# Patient Record
Sex: Female | Born: 1990 | Race: White | Hispanic: No | Marital: Single | Smoking: Current every day smoker
Health system: Southern US, Community
[De-identification: ages and names within clinical notes are randomized; demographics above are authoritative.]

## PROBLEM LIST (undated history)

## (undated) ENCOUNTER — Ambulatory Visit: Admission: EM | Payer: BC Managed Care – PPO | Source: Home / Self Care

## (undated) DIAGNOSIS — R011 Cardiac murmur, unspecified: Secondary | ICD-10-CM

## (undated) DIAGNOSIS — J449 Chronic obstructive pulmonary disease, unspecified: Secondary | ICD-10-CM

## (undated) DIAGNOSIS — J45909 Unspecified asthma, uncomplicated: Secondary | ICD-10-CM

## (undated) HISTORY — PX: TONSILLECTOMY: SUR1361

---
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
## 2010-12-09 ENCOUNTER — Emergency Department: Payer: Self-pay | Admitting: Emergency Medicine

## 2012-06-13 ENCOUNTER — Emergency Department: Payer: Self-pay | Admitting: Unknown Physician Specialty

## 2012-06-13 LAB — COMPREHENSIVE METABOLIC PANEL
Albumin: 4.1 g/dL (ref 3.4–5.0)
Anion Gap: 7 (ref 7–16)
BUN: 9 mg/dL (ref 7–18)
EGFR (Non-African Amer.): >60
SGOT(AST): 17 U/L (ref 15–37)
SGPT (ALT): 16 U/L
Total Protein: 7.3 g/dL (ref 6.4–8.2)

## 2012-06-13 LAB — CBC
HCT: 40.3 % (ref 35.0–47.0)
HGB: 13.6 g/dL (ref 12.0–16.0)
MCHC: 33.8 g/dL (ref 32.0–36.0)
MCV: 96 fL (ref 80–100)
Platelet: 189 10*3/uL (ref 150–440)
RDW: 13.8 % (ref 11.5–14.5)

## 2012-06-13 LAB — URINALYSIS, COMPLETE
Glucose,UR: NEGATIVE mg/dL (ref 0–75)
Leukocyte Esterase: NEGATIVE
Nitrite: NEGATIVE
RBC,UR: <1 /HPF (ref 0–5)
Specific Gravity: 1.015 (ref 1.003–1.030)

## 2014-04-11 ENCOUNTER — Emergency Department: Payer: Self-pay | Admitting: Emergency Medicine

## 2014-06-08 ENCOUNTER — Emergency Department: Payer: Self-pay | Admitting: Emergency Medicine

## 2014-06-08 LAB — URINALYSIS, COMPLETE
Bilirubin,UR: NEGATIVE
GLUCOSE, UR: NEGATIVE mg/dL (ref 0–75)
Ketone: NEGATIVE
Nitrite: NEGATIVE
PH: 7 (ref 4.5–8.0)
SPECIFIC GRAVITY: 1.004 (ref 1.003–1.030)
WBC UR: 117 /HPF (ref 0–5)

## 2014-06-08 LAB — COMPREHENSIVE METABOLIC PANEL
ALBUMIN: 4 g/dL (ref 3.4–5.0)
ALK PHOS: 88 U/L
ALT: 16 U/L (ref 12–78)
ANION GAP: 8 (ref 7–16)
AST: 27 U/L (ref 15–37)
BUN: 5 mg/dL — AB (ref 7–18)
Bilirubin,Total: 0.6 mg/dL (ref 0.2–1.0)
CO2: 23 mmol/L (ref 21–32)
CREATININE: 0.45 mg/dL — AB (ref 0.60–1.30)
Calcium, Total: 9.5 mg/dL (ref 8.5–10.1)
Chloride: 107 mmol/L (ref 98–107)
EGFR (Non-African Amer.): >60
Glucose: 96 mg/dL (ref 65–99)
Osmolality: 273 (ref 275–301)
Potassium: 4.6 mmol/L (ref 3.5–5.1)
SODIUM: 138 mmol/L (ref 136–145)
Total Protein: 7.3 g/dL (ref 6.4–8.2)

## 2014-06-08 LAB — CBC
HCT: 43.4 % (ref 35.0–47.0)
HGB: 14.6 g/dL (ref 12.0–16.0)
MCH: 32.3 pg (ref 26.0–34.0)
MCHC: 33.6 g/dL (ref 32.0–36.0)
MCV: 96 fL (ref 80–100)
Platelet: 194 10*3/uL (ref 150–440)
RBC: 4.52 10*6/uL (ref 3.80–5.20)
RDW: 13.6 % (ref 11.5–14.5)
WBC: 16 10*3/uL — AB (ref 3.6–11.0)

## 2014-06-08 LAB — PREGNANCY, URINE: Pregnancy Test, Urine: NEGATIVE m[IU]/mL

## 2014-07-06 ENCOUNTER — Emergency Department: Payer: Self-pay | Admitting: Emergency Medicine

## 2014-07-06 LAB — URINALYSIS, COMPLETE
BILIRUBIN, UR: NEGATIVE
BLOOD: NEGATIVE
Glucose,UR: NEGATIVE mg/dL (ref 0–75)
KETONE: NEGATIVE
Nitrite: NEGATIVE
Ph: 7 (ref 4.5–8.0)
SPECIFIC GRAVITY: 1.014 (ref 1.003–1.030)
Squamous Epithelial: 1

## 2014-07-09 LAB — URINE CULTURE

## 2014-11-13 ENCOUNTER — Emergency Department: Payer: Self-pay | Admitting: Emergency Medicine

## 2015-11-15 ENCOUNTER — Encounter: Payer: Self-pay | Admitting: Emergency Medicine

## 2015-11-15 ENCOUNTER — Emergency Department
Admission: EM | Admit: 2015-11-15 | Discharge: 2015-11-15 | Disposition: A | Discharge status: Home / Self Care | Payer: Self-pay | Attending: Emergency Medicine | Admitting: Emergency Medicine

## 2015-11-15 DIAGNOSIS — M545 Low back pain, unspecified: Secondary | ICD-10-CM

## 2015-11-15 DIAGNOSIS — N390 Urinary tract infection, site not specified: Secondary | ICD-10-CM | POA: Insufficient documentation

## 2015-11-15 DIAGNOSIS — Z3202 Encounter for pregnancy test, result negative: Secondary | ICD-10-CM | POA: Insufficient documentation

## 2015-11-15 DIAGNOSIS — F17200 Nicotine dependence, unspecified, uncomplicated: Secondary | ICD-10-CM | POA: Insufficient documentation

## 2015-11-15 DIAGNOSIS — J45909 Unspecified asthma, uncomplicated: Secondary | ICD-10-CM | POA: Insufficient documentation

## 2015-11-15 HISTORY — DX: Unspecified asthma, uncomplicated: J45.909

## 2015-11-15 HISTORY — DX: Cardiac murmur, unspecified: R01.1

## 2015-11-15 LAB — URINALYSIS COMPLETE WITH MICROSCOPIC (ARMC ONLY)
BILIRUBIN URINE: NEGATIVE
Glucose, UA: NEGATIVE mg/dL
KETONES UR: NEGATIVE mg/dL
NITRITE: NEGATIVE
PH: 7 (ref 5.0–8.0)
Protein, ur: NEGATIVE mg/dL
Specific Gravity, Urine: 1.015 (ref 1.005–1.030)

## 2015-11-15 LAB — POCT PREGNANCY, URINE: Preg Test, Ur: NEGATIVE

## 2015-11-15 MED ORDER — CEPHALEXIN 500 MG PO CAPS: 500.0000 mg | ORAL_CAPSULE | Freq: Three times a day (TID) | ORAL | Status: DC

## 2015-11-15 MED ORDER — PHENAZOPYRIDINE HCL 200 MG PO TABS
200.0000 mg | ORAL_TABLET | Freq: Three times a day (TID) | ORAL | Status: AC | PRN
Start: 2015-11-15 — End: 2016-11-14

## 2015-11-15 NOTE — ED Notes (Signed)
Has been having lower back pain for about 2 weeks   Now having nasal congestion and body aches

## 2015-11-15 NOTE — Discharge Instructions (Signed)
Please seek medical attention for any high fevers, chest pain, shortness of breath, change in behavior, persistent vomiting, bloody stool or any other new or concerning symptoms. ° ° °Urinary Tract Infection °Urinary tract infections (UTIs) can develop anywhere along your urinary tract. Your urinary tract is your body's drainage system for removing wastes and extra water. Your urinary tract includes two kidneys, two ureters, a bladder, and a urethra. Your kidneys are a pair of bean-shaped organs. Each kidney is about the size of your fist. They are located below your ribs, one on each side of your spine. °CAUSES °Infections are caused by microbes, which are microscopic organisms, including fungi, viruses, and bacteria. These organisms are so small that they can only be seen through a microscope. Bacteria are the microbes that most commonly cause UTIs. °SYMPTOMS  °Symptoms of UTIs may vary by age and gender of the patient and by the location of the infection. Symptoms in young women typically include a frequent and intense urge to urinate and a painful, burning feeling in the bladder or urethra during urination. Older women and men are more likely to be tired, shaky, and weak and have muscle aches and abdominal pain. A fever may mean the infection is in your kidneys. Other symptoms of a kidney infection include pain in your back or sides below the ribs, nausea, and vomiting. °DIAGNOSIS °To diagnose a UTI, your caregiver will ask you about your symptoms. Your caregiver will also ask you to provide a urine sample. The urine sample will be tested for bacteria and white blood cells. White blood cells are made by your body to help fight infection. °TREATMENT  °Typically, UTIs can be treated with medication. Because most UTIs are caused by a bacterial infection, they usually can be treated with the use of antibiotics. The choice of antibiotic and length of treatment depend on your symptoms and the type of bacteria causing  your infection. °HOME CARE INSTRUCTIONS °· If you were prescribed antibiotics, take them exactly as your caregiver instructs you. Finish the medication even if you feel better after you have only taken some of the medication. °· Drink enough water and fluids to keep your urine clear or pale yellow. °· Avoid caffeine, tea, and carbonated beverages. They tend to irritate your bladder. °· Empty your bladder often. Avoid holding urine for long periods of time. °· Empty your bladder before and after sexual intercourse. °· After a bowel movement, women should cleanse from front to back. Use each tissue only once. °SEEK MEDICAL CARE IF:  °· You have back pain. °· You develop a fever. °· Your symptoms do not begin to resolve within 3 days. °SEEK IMMEDIATE MEDICAL CARE IF:  °· You have severe back pain or lower abdominal pain. °· You develop chills. °· You have nausea or vomiting. °· You have continued burning or discomfort with urination. °MAKE SURE YOU:  °· Understand these instructions. °· Will watch your condition. °· Will get help right away if you are not doing well or get worse. °  °This information is not intended to replace advice given to you by your health care provider. Make sure you discuss any questions you have with your health care provider. °  °Document Released: 09/13/2005 Document Revised: 08/25/2015 Document Reviewed: 01/12/2012 °Elsevier Interactive Patient Education ©2016 Elsevier Inc. ° °

## 2015-11-15 NOTE — ED Provider Notes (Signed)
[facility] Emergency Department Provider Note   ____________________________________________  Time seen: 1510  I have reviewed the triage vital signs and the nursing notes.   HISTORY  Chief Complaint Back Pain   History limited by: Not Limited   HPI Ann Gray is a 24 y.o. female who presents to the emergency department today with 2 main complaints. Her first complaint is left lower back pain. She states this is been going on for the past 2-3 weeks. It has been fairly constant. She states that it is a moderate to severe. She denies any obvious injury to her back. She states that she feels she has had some increased urination with this pain however denies any painful urination. In addition patient states for the past 3-4 days she's been having more generalized body aches, cold like symptoms including congestion and some chills. She states she has had multiple sick contacts including both at work and her family.    Past Medical History  Diagnosis Date  . Asthma   . Murmur, cardiac     There are no active problems to display for this patient.   History reviewed. No pertinent past surgical history.  No current outpatient prescriptions on file.  Allergies Review of patient's allergies indicates no known allergies.  No family history on file.  Social History Social History  Substance Use Topics  . Smoking status: Current Every Day Smoker  . Smokeless tobacco: None  . Alcohol Use: No    Review of Systems  Constitutional: Negative for fever. Cardiovascular: Negative for chest pain.  Respiratory: Negative for shortness of breath.Positive for cough and congestion  Gastrointestinal: Negative for abdominal pain, vomiting and diarrhea. Genitourinary: Negative for dysuria. Musculoskeletal: Positive for left low back. Skin: Negative for rash. Neurological: Negative for headaches, focal weakness or numbness.  10-point ROS otherwise  negative.  ____________________________________________   PHYSICAL EXAM:  VITAL SIGNS: ED Triage Vitals  Enc Vitals Group     BP 11/15/15 1459 117/65 mmHg     Pulse Rate 11/15/15 1459 87     Resp 11/15/15 1459 18     Temp 11/15/15 1459 99 F (37.2 C)     Temp Source 11/15/15 1459 Oral     SpO2 11/15/15 1459 100 %     Weight 11/15/15 1459 180 lb (81.647 kg)     Height 11/15/15 1459  (1.651 m)     Head Cir --      Peak Flow --      Pain Score 11/15/15 1456 4   Constitutional: Alert and oriented. Well appearing and in no distress. Eyes: Conjunctivae are normal. PERRL. Normal extraocular movements. ENT   Head: Normocephalic and atraumatic.   Nose: No congestion/rhinnorhea.   Mouth/Throat: Mucous membranes are moist.   Neck: No stridor. Hematological/Lymphatic/Immunilogical: No cervical lymphadenopathy. Cardiovascular: Normal rate, regular rhythm.  No murmurs, rubs, or gallops. Respiratory: Normal respiratory effort without tachypnea nor retractions. Breath sounds are clear and equal bilaterally. No wheezes/rales/rhonchi. Gastrointestinal: Soft and nontender. No distention. There is no CVA tenderness. Genitourinary: Deferred Musculoskeletal: Normal range of motion in all extremities. No joint effusions.  No lower extremity tenderness nor edema. Neurologic:  Normal speech and language. No gross focal neurologic deficits are appreciated.  Skin:  Skin is warm, dry and intact. No rash noted. Psychiatric: Mood and affect are normal. Speech and behavior are normal. Patient exhibits appropriate insight and judgment.  ____________________________________________    LABS (pertinent positives/negatives)  Labs Reviewed  URINALYSIS COMPLETEWITH MICROSCOPIC (  [facility] ONLY) - Abnormal; Notable for the following:    Color, Urine YELLOW (*)    APPearance HAZY (*)    Hgb urine dipstick 1+ (*)    Leukocytes, UA TRACE (*)    Bacteria, UA RARE (*)    Squamous Epithelial / LPF  0-5 (*)    All other components within normal limits  POCT PREGNANCY, URINE    ____________________________________________   EKG  None  ____________________________________________    RADIOLOGY  None   ____________________________________________   PROCEDURES  Procedure(s) performed: None  Critical Care performed: No  ____________________________________________   INITIAL IMPRESSION / ASSESSMENT AND PLAN / ED COURSE  Pertinent labs & imaging results that were available during my care of the patient were reviewed by me and considered in my medical decision making (see chart for details).  Patient presents to the emergency department today with concerns for cold and low back pain. Patient was positive for UTI. Will give patient prescription for antibiotics. Decided to discuss with the patient care for, cold.  ____________________________________________   FINAL CLINICAL IMPRESSION(S) / ED DIAGNOSES  Final diagnoses:  UTI (lower urinary tract infection)  Left-sided low back pain without sciatica     Phineas SemenGraydon Ren Aspinall, MD 11/15/15 1709

## 2016-03-11 ENCOUNTER — Encounter: Payer: Self-pay | Admitting: Emergency Medicine

## 2016-03-11 ENCOUNTER — Emergency Department
Admission: EM | Admit: 2016-03-11 | Discharge: 2016-03-12 | Disposition: A | Discharge status: Home / Self Care | Payer: BLUE CROSS/BLUE SHIELD | Attending: Emergency Medicine | Admitting: Emergency Medicine

## 2016-03-11 DIAGNOSIS — K029 Dental caries, unspecified: Secondary | ICD-10-CM

## 2016-03-11 DIAGNOSIS — Z792 Long term (current) use of antibiotics: Secondary | ICD-10-CM | POA: Insufficient documentation

## 2016-03-11 DIAGNOSIS — K0889 Other specified disorders of teeth and supporting structures: Secondary | ICD-10-CM | POA: Diagnosis present

## 2016-03-11 DIAGNOSIS — F17200 Nicotine dependence, unspecified, uncomplicated: Secondary | ICD-10-CM | POA: Insufficient documentation

## 2016-03-11 DIAGNOSIS — K047 Periapical abscess without sinus: Secondary | ICD-10-CM | POA: Insufficient documentation

## 2016-03-11 MED ORDER — PENICILLIN V POTASSIUM 500 MG PO TABS
500.0000 mg | ORAL_TABLET | Freq: Four times a day (QID) | ORAL | Status: DC
  Administered 2016-03-11: 500 mg via ORAL
  Filled 2016-03-11: qty 1

## 2016-03-11 MED ORDER — LIDOCAINE-EPINEPHRINE 2 %-1:100000 IJ SOLN
INTRAMUSCULAR | Status: AC
  Filled 2016-03-11: qty 3.4

## 2016-03-11 MED ORDER — TRAMADOL HCL 50 MG PO TABS: 50.0000 mg | ORAL_TABLET | Freq: Four times a day (QID) | ORAL | Status: DC | PRN

## 2016-03-11 MED ORDER — LIDOCAINE-EPINEPHRINE 2 %-1:100000 IJ SOLN: 1.7000 mL | Freq: Once | INTRAMUSCULAR | Status: DC

## 2016-03-11 MED ORDER — IBUPROFEN 800 MG PO TABS: 800.0000 mg | ORAL_TABLET | Freq: Three times a day (TID) | ORAL | Status: DC | PRN

## 2016-03-11 MED ORDER — PENICILLIN V POTASSIUM 500 MG PO TABS: 500.0000 mg | ORAL_TABLET | Freq: Four times a day (QID) | ORAL | Status: DC

## 2016-03-11 NOTE — ED Notes (Signed)
C/o dental pain to right jaw (upper and lower).  Onset of pain a couple of weeks ago.  Pain has worsened over the past few days.  Has appointment with Dentist on Monday 3/27.

## 2016-03-11 NOTE — Discharge Instructions (Signed)
Dental Caries °Dental caries is tooth decay. This decay can cause a hole in teeth (cavity) that can get bigger and deeper over time. °HOME CARE °· Brush and floss your teeth. Do this at least two times a day. °· Use a fluoride toothpaste. °· Use a mouth rinse if told by your dentist or doctor. °· Eat less sugary and starchy foods. Drink less sugary drinks. °· Avoid snacking often on sugary and starchy foods. Avoid sipping often on sugary drinks. °· Keep regular checkups and cleanings with your dentist. °· Use fluoride supplements if told by your dentist or doctor. °· Allow fluoride to be applied to teeth if told by your dentist or doctor. °  °This information is not intended to replace advice given to you by your health care provider. Make sure you discuss any questions you have with your health care provider. °  °Document Released: 09/12/2008 Document Revised: 12/25/2014 Document Reviewed: 12/06/2012 °Elsevier Interactive Patient Education ©2016 Elsevier Inc. ° °Dental Pain °Dental pain may be caused by many things, including: °· Tooth decay (cavities or caries). Cavities cause the nerve of your tooth to be open to air and hot or cold temperatures. This can cause pain or discomfort. °· Abscess or infection. A dental abscess is an area that is full of infected pus from a bacterial infection in the inner part of the tooth (pulp). It usually happens at the end of the tooth's root. °· Injury. °· An unknown reason (idiopathic). °Your pain may be mild or severe. It may only happen when: °· You are chewing. °· You are exposed to hot or cold temperature. °· You are eating or drinking sugary foods or beverages, such as: °¨ Soda. °¨ Candy. °Your pain may also be there all of the time. °HOME CARE °Watch your dental pain for any changes. Do these things to lessen your discomfort: °· Take medicines only as told by your dentist. °· If your dentist tells you to take an antibiotic medicine, finish all of it even if you start to  feel better. °· Keep all follow-up visits as told by your dentist. This is important. °· Do not apply heat to the outside of your face. °· Rinse your mouth or gargle with salt water if told by your dentist. This helps with pain and swelling. °¨ You can make salt water by adding ¼ tsp of salt to 1 cup of warm water. °· Apply ice to the painful area of your face: °¨ Put ice in a plastic bag. °¨ Place a towel between your skin and the bag. °¨ Leave the ice on for 20 minutes, 2-3 times per day. °· Avoid foods or drinks that cause you pain, such as: °¨ Very hot or very cold foods or drinks. °¨ Sweet or sugary foods or drinks. °GET HELP IF: °· Your pain is not helped with medicines. °· Your symptoms are worse. °· You have new symptoms. °GET HELP RIGHT AWAY IF: °· You cannot open your mouth. °· You are having trouble breathing or swallowing. °· You have a fever. °· Your face, neck, or jaw is puffy (swollen). °  °This information is not intended to replace advice given to you by your health care provider. Make sure you discuss any questions you have with your health care provider. °  °Document Released: 05/22/2008 Document Revised: 04/20/2015 Document Reviewed: 11/30/2014 °Elsevier Interactive Patient Education ©2016 Elsevier Inc. ° °

## 2016-03-11 NOTE — ED Provider Notes (Signed)
CSN: [id number]     Arrival date & time 03/11/16  2225 History   First MD Initiated Contact with Patient 03/11/16 2327     Chief Complaint  Patient presents with  . Dental Pain     (Consider location/radiation/quality/duration/timing/severity/associated sxs/prior Treatment) HPI  25 year old female with right dental pain. She states the right upper and lower tooth in the back has been painful for 2 weeks. She has a dentist appointment in 2 days for tooth extraction. Patient's pain is severe 10 out of 10, she is unable sleep at night. She has had some swelling along the teeth with no drainage. She is tolerating by mouth well. No fevers. No relief with ibuprofen 800 mg.  Past Medical History  Diagnosis Date  . Asthma   . Murmur, cardiac    No past surgical history on file. No family history on file. Social History  Substance Use Topics  . Smoking status: Current Every Day Smoker  . Smokeless tobacco: None  . Alcohol Use: No   OB History    No data available     Review of Systems  Constitutional: Negative.  Negative for fever and chills.  HENT: Positive for dental problem. Negative for drooling, facial swelling, mouth sores, trouble swallowing and voice change.   Respiratory: Negative for chest tightness and shortness of breath.   Cardiovascular: Negative for chest pain.  Gastrointestinal: Negative for nausea, vomiting, abdominal pain and diarrhea.  Musculoskeletal: Negative for arthralgias, neck pain and neck stiffness.  Skin: Negative.   Psychiatric/Behavioral: Negative for confusion.  All other systems reviewed and are negative.     Allergies  Septra  Home Medications   Prior to Admission medications   Medication Sig Start Date End Date Taking? Authorizing Provider  cephALEXin (KEFLEX) 500 MG capsule Take 1 capsule (500 mg total) by mouth 3 (three) times daily. 11/15/15   Phineas Semen, MD  ibuprofen (ADVIL,MOTRIN) 800 MG tablet Take 1 tablet (800 mg total) by  mouth every 8 (eight) hours as needed. 03/11/16   Evon Slack, PA-C  penicillin v potassium (VEETID) 500 MG tablet Take 1 tablet (500 mg total) by mouth 4 (four) times daily. 03/11/16   Evon Slack, PA-C  phenazopyridine (PYRIDIUM) 200 MG tablet Take 1 tablet (200 mg total) by mouth 3 (three) times daily as needed for pain. 11/15/15 11/14/16  Phineas Semen, MD  traMADol (ULTRAM) 50 MG tablet Take 1 tablet (50 mg total) by mouth every 6 (six) hours as needed. 03/11/16   Evon Slack, PA-C   BP 131/73 mmHg  Pulse 72  Temp(Src) 98.2 F (36.8 C) (Oral)  Resp 18  Ht  (1.651 m)  Wt 79.379 kg  BMI 29.12 kg/m2  SpO2 100%  LMP 02/21/2016 Physical Exam  Constitutional: She is oriented to person, place, and time. She appears well-developed and well-nourished. No distress.  HENT:  Head: Normocephalic and atraumatic.  Right Ear: External ear normal.  Left Ear: External ear normal.  Nose: Nose normal.  Mouth/Throat: Uvula is midline and oropharynx is clear and moist. No oral lesions. No trismus in the jaw. Normal dentition. Dental abscesses and dental caries present. No uvula swelling.    Eyes: EOM are normal.  Neck: Normal range of motion. Neck supple.  Cardiovascular: Normal rate.  Exam reveals no gallop and no friction rub.   No murmur heard. Pulmonary/Chest: Effort normal and breath sounds normal. No respiratory distress.  Neurological: She is alert and oriented to person, place, and time.  Skin: Skin is warm and dry.  Psychiatric: She has a normal mood and affect. Her behavior is normal. Thought content normal.    ED Course  Procedures (including critical care time) Patient agreed and consented to a right third mandibular molar and a right maxillary third molar block. 1.7 cc of 1% lidocaine with epi was injected along the nerve root. Patient tolerated both procedures well and has significant reduction in pain.  Labs Review Labs Reviewed - No data to display  Imaging  Review No results found. I have personally reviewed and evaluated these images and lab results as part of my medical decision-making.   EKG Interpretation None      MDM   Final diagnoses:  Pain, dental  Pain due to dental caries    [age] year old female with dental pain of the right maxillary third molar and right third mandibular molar she is placed on penicillin VK. Tramadol and ibuprofen. She agreed and consented to 2 dental blocks. Patient tolerated procedure well and so instant pain relief. She'll follow-up with dentist in 2 days for tooth extraction. Return to the ED for any worsening symptoms urgent changes in her health.    Evon Slackhomas C Gaines, PA-C 03/11/16 2347  Arnaldo NatalPaul F Malinda, MD 03/12/16 [phone]

## 2016-03-12 NOTE — ED Notes (Signed)
Pt. Going home with friend 

## 2017-11-14 ENCOUNTER — Encounter: Payer: Self-pay | Admitting: Emergency Medicine

## 2017-11-14 ENCOUNTER — Other Ambulatory Visit: Payer: Self-pay

## 2017-11-14 ENCOUNTER — Emergency Department
Admission: EM | Admit: 2017-11-14 | Discharge: 2017-11-14 | Disposition: A | Discharge status: Home / Self Care | Payer: BLUE CROSS/BLUE SHIELD | Attending: Emergency Medicine | Admitting: Emergency Medicine

## 2017-11-14 DIAGNOSIS — J45909 Unspecified asthma, uncomplicated: Secondary | ICD-10-CM | POA: Insufficient documentation

## 2017-11-14 DIAGNOSIS — F17200 Nicotine dependence, unspecified, uncomplicated: Secondary | ICD-10-CM | POA: Insufficient documentation

## 2017-11-14 DIAGNOSIS — J069 Acute upper respiratory infection, unspecified: Secondary | ICD-10-CM | POA: Insufficient documentation

## 2017-11-14 MED ORDER — AZITHROMYCIN 250 MG PO TABS: ORAL_TABLET | ORAL | 0 refills | Status: DC

## 2017-11-14 MED ORDER — METHYLPREDNISOLONE 4 MG PO TBPK: ORAL_TABLET | ORAL | 0 refills | Status: DC

## 2017-11-14 MED ORDER — ALBUTEROL SULFATE HFA 108 (90 BASE) MCG/ACT IN AERS: 2.0000 | INHALATION_SPRAY | Freq: Four times a day (QID) | RESPIRATORY_TRACT | 2 refills | Status: DC | PRN

## 2017-11-14 NOTE — Discharge Instructions (Signed)
Follow-up with your regular doctor if you're not better in 3-5 days, you may also use the acute care if hyou need to, use the medication as prescribed, use over-the-counter cold medicines, return to the emergency department if you're feeling worse

## 2017-11-14 NOTE — ED Provider Notes (Signed)
[facility] Emergency Department Provider Note  ____________________________________________   First MD Initiated Contact with Patient 11/14/17 1208     (approximate)  I have reviewed the triage vital signs and the nursing notes.   HISTORY  Chief Complaint URI and Cough    HPI Ann Gray is a 26 y.o. female complains of cough and congestion for 1-1/2 weeks, states the cold medicine works but then when it wears off she starts coughing and hacking again, the mucus is  green to yellow, denies fever or chills, states she feels very fatigued and some shortness of breath, states she is wheezing at night, has history of asthma, does not have an inhaler, is a smoker 1.5 packs per day. Denies chest pain, vomiting or diarrhea   Past Medical History:  Diagnosis Date  . Asthma   . Murmur, cardiac     There are no active problems to display for this patient.   History reviewed. No pertinent surgical history.  Prior to Admission medications   Medication Sig Start Date End Date Taking? Authorizing Provider  albuterol (PROVENTIL HFA;VENTOLIN HFA) 108 (90 Base) MCG/ACT inhaler Inhale 2 puffs into the lungs every 6 (six) hours as needed for wheezing or shortness of breath. 11/14/17   Sherrie MustacheFisher, Roselyn BeringSusan W, PA-C  azithromycin (ZITHROMAX Z-PAK) 250 MG tablet 2 pills today then 1 pill a day for 4 days 11/14/17   Sherrie MustacheFisher, Roselyn BeringSusan W, PA-C  methylPREDNISolone (MEDROL DOSEPAK) 4 MG TBPK tablet Take 6 pills on day one then decrease by 1 pill each day 11/14/17   Faythe GheeFisher, Susan W, PA-C    Allergies Septra [sulfamethoxazole-trimethoprim]  No family history on file.  Social History Social History   Tobacco Use  . Smoking status: Current Every Day Smoker  . Smokeless tobacco: Never Used  Substance Use Topics  . Alcohol use: No  . Drug use: Not on file    Review of Systems  Constitutional: No fever/chills Eyes: No visual changes. ENT: No sore throat. Positive runny  nose and congestion Respiratory: Positive cough cough Genitourinary: Negative for dysuria. Musculoskeletal: Negative for back pain. Skin: Negative for rash.    ____________________________________________   PHYSICAL EXAM:  VITAL SIGNS: ED Triage Vitals  Enc Vitals Group     BP 11/14/17 1127 139/78     Pulse Rate 11/14/17 1127 88     Resp 11/14/17 1127 18     Temp 11/14/17 1127 (!) 97.5 F (36.4 C)     Temp Source 11/14/17 1127 Oral     SpO2 11/14/17 1127 100 %     Weight 11/14/17 1128 150 lb (68 kg)     Height 11/14/17 1128 5\' 5"  (1.651 m)     Head Circumference --      Peak Flow --      Pain Score --      Pain Loc --      Pain Edu? --      Excl. in GC? --     Constitutional: Alert and oriented. Well appearing and in no acute distress. Eyes: Conjunctivae are normal.  Head: Atraumatic. Nose: No congestion/rhinnorhea. Mouth/Throat: Mucous membranes are moist.  Throat is irritated Cardiovascular: Normal rate, regular rhythm. Respiratory: Normal respiratory effort.  No retractions lungs are clear to auscultation, cough is dry and hacking GU: deferred Musculoskeletal: FROM all extremities, warm and well perfused Neurologic:  Normal speech and language.  Skin:  Skin is warm, dry and intact. No rash noted. Psychiatric: Mood and affect are normal.  Speech and behavior are normal.  ____________________________________________   LABS (all labs ordered are listed, but only abnormal results are displayed)  Labs Reviewed - No data to display ____________________________________________   ____________________________________________  RADIOLOGY    ____________________________________________   PROCEDURES  Procedure(s) performed: No      ____________________________________________   INITIAL IMPRESSION / ASSESSMENT AND PLAN / ED COURSE  Pertinent labs & imaging results that were available during my care of the patient were reviewed by me and considered in  my medical decision making (see chart for details).  Is a 26 year old female that appears well but tired, cough is dry and hacking once she is in exam room, otherwise exam is normal, patient has been sick for 1.5 weeks, prescription for Z-Pak, Medrol Dosepak, albuterol inhaler, patient was given a work note, she is to return to the emergency department or her regular doctor if she is not better in 3-5 days, she is instructed to use over-the-counter cough medicines      ____________________________________________   FINAL CLINICAL IMPRESSION(S) / ED DIAGNOSES  Final diagnoses:  Acute upper respiratory infection      NEW MEDICATIONS STARTED DURING THIS VISIT:  This SmartLink is deprecated. Use AVSMEDLIST instead to display the medication list for a patient.   Note:  This document was prepared using Dragon voice recognition software and may include unintentional dictation errors.    Faythe GheeFisher, Susan W, PA-C 11/14/17 1251    Dionne BucySiadecki, Sebastian, MD 11/14/17 1308

## 2017-11-14 NOTE — ED Notes (Signed)
Pt sitting on bed, denies pain, or shob at this time. Appears in no distress.

## 2017-11-14 NOTE — ED Triage Notes (Signed)
ARrives C/O, congestion, chills.  Cough x 2 weeks.  Has been using OTC cold medications without effect.

## 2018-04-06 ENCOUNTER — Other Ambulatory Visit: Payer: Self-pay

## 2018-04-06 ENCOUNTER — Encounter: Payer: Self-pay | Admitting: Emergency Medicine

## 2018-04-06 ENCOUNTER — Emergency Department: Payer: BLUE CROSS/BLUE SHIELD

## 2018-04-06 ENCOUNTER — Emergency Department
Admission: EM | Admit: 2018-04-06 | Discharge: 2018-04-06 | Disposition: A | Discharge status: Home / Self Care | Payer: BLUE CROSS/BLUE SHIELD | Attending: Emergency Medicine | Admitting: Emergency Medicine

## 2018-04-06 DIAGNOSIS — J449 Chronic obstructive pulmonary disease, unspecified: Secondary | ICD-10-CM | POA: Diagnosis not present

## 2018-04-06 DIAGNOSIS — F17200 Nicotine dependence, unspecified, uncomplicated: Secondary | ICD-10-CM | POA: Diagnosis not present

## 2018-04-06 DIAGNOSIS — R05 Cough: Secondary | ICD-10-CM | POA: Diagnosis present

## 2018-04-06 DIAGNOSIS — J40 Bronchitis, not specified as acute or chronic: Secondary | ICD-10-CM

## 2018-04-06 MED ORDER — METHYLPREDNISOLONE SODIUM SUCC 125 MG IJ SOLR
125.0000 mg | Freq: Once | INTRAMUSCULAR | Status: AC
  Administered 2018-04-06: 125 mg via INTRAMUSCULAR
  Filled 2018-04-06: qty 2

## 2018-04-06 MED ORDER — PREDNISONE 10 MG PO TABS: ORAL_TABLET | ORAL | 0 refills | Status: DC

## 2018-04-06 MED ORDER — ALBUTEROL SULFATE HFA 108 (90 BASE) MCG/ACT IN AERS: 2.0000 | INHALATION_SPRAY | Freq: Four times a day (QID) | RESPIRATORY_TRACT | 0 refills | Status: DC | PRN

## 2018-04-06 MED ORDER — IPRATROPIUM-ALBUTEROL 0.5-2.5 (3) MG/3ML IN SOLN
3.0000 mL | Freq: Once | RESPIRATORY_TRACT | Status: AC
  Administered 2018-04-06: 3 mL via RESPIRATORY_TRACT
  Filled 2018-04-06: qty 3

## 2018-04-06 MED ORDER — BENZONATATE 100 MG PO CAPS: 100.0000 mg | ORAL_CAPSULE | Freq: Three times a day (TID) | ORAL | 0 refills | Status: DC | PRN

## 2018-04-06 NOTE — ED Notes (Signed)
Cough x 1 week, states hx of bronchitis, sxs feel similar, states worse when she is outside. Pt has hx of asthma. Does not have inhaler. Coughly mostly non-productive but is productive at times with yellow sputum.  Pt's wife also here to be checked for similar sxs.

## 2018-04-06 NOTE — ED Provider Notes (Signed)
[facility] Emergency Department Provider Note  ____________________________________________  Time seen: Approximately 9:46 AM  I have reviewed the triage vital signs and the nursing notes.   HISTORY  Chief Complaint Cough    HPI Ann Gray is a 27 y.o. female that presents to emergency department for evaluation of productive cough with yellow sputum and shortness of breath for 10 days.  Patient states that she does not have any difficulty breathing while inside of the but feels short of breath when she is outside. She states that she has felt this way "forever," but worse in the last 10 days. She had bronchitis last year and this feels the same.  She smokes a pack of cigarettes per day.  She has a history of asthma but is out of her inhaler.  Last year Jerilynn Som worked best for her. Her wife feels worse with cold symptoms, which prompted her to come to the ER. No fever, nasal congestion, chest pain, nausea, vomiting, abdominal pain.   Past Medical History:  Diagnosis Date  . Asthma   . Murmur, cardiac     There are no active problems to display for this patient.   History reviewed. No pertinent surgical history.  Prior to Admission medications   Medication Sig Start Date End Date Taking? Authorizing Provider  albuterol (PROVENTIL HFA;VENTOLIN HFA) 108 (90 Base) MCG/ACT inhaler Inhale 2 puffs into the lungs every 6 (six) hours as needed for wheezing or shortness of breath. 04/06/18   Enid Derry, PA-C  azithromycin (ZITHROMAX Z-PAK) 250 MG tablet 2 pills today then 1 pill a day for 4 days 11/14/17   Sherrie Mustache Roselyn Bering, PA-C  benzonatate (TESSALON PERLES) 100 MG capsule Take 1 capsule (100 mg total) by mouth 3 (three) times daily as needed for cough. 04/06/18 04/06/19  Enid Derry, PA-C  methylPREDNISolone (MEDROL DOSEPAK) 4 MG TBPK tablet Take 6 pills on day one then decrease by 1 pill each day 11/14/17   Faythe Ghee, PA-C  predniSONE  (DELTASONE) 10 MG tablet Take 6 tablets on day 1, take 5 tablets on day 2, take 4 tablets on day 3, take 3 tablets on day 4, take 2 tablets on day 5, take 1 tablet on day 6 04/06/18   Enid Derry, PA-C    Allergies Septra [sulfamethoxazole-trimethoprim]  No family history on file.  Social History Social History   Tobacco Use  . Smoking status: Current Every Day Smoker  . Smokeless tobacco: Never Used  Substance Use Topics  . Alcohol use: No  . Drug use: Not on file     Review of Systems  Constitutional: No fever/chills Eyes: No visual changes. No discharge. ENT: Negative for congestion and rhinorrhea. Cardiovascular: No chest pain. Respiratory: Positive for cough.  Gastrointestinal: No abdominal pain.  No nausea, no vomiting.  No diarrhea.  No constipation. Musculoskeletal: Negative for musculoskeletal pain. Skin: Negative for rash, abrasions, lacerations, ecchymosis. Neurological: Negative for headaches.   ____________________________________________   PHYSICAL EXAM:  VITAL SIGNS: ED Triage Vitals  Enc Vitals Group     BP 04/06/18 0902 (!) 134/93     Pulse Rate 04/06/18 0902 96     Resp 04/06/18 0902 16     Temp 04/06/18 0902 98.3 F (36.8 C)     Temp Source 04/06/18 0902 Oral     SpO2 04/06/18 0902 100 %     Weight 04/06/18 0903 145 lb (65.8 kg)     Height 04/06/18 0903 5\' 5"  (1.651 m)  Head Circumference --      Peak Flow --      Pain Score 04/06/18 0903 0     Pain Loc --      Pain Edu? --      Excl. in GC? --      Constitutional: Alert and oriented. Well appearing and in no acute distress. Eyes: Conjunctivae are normal. PERRL. EOMI. No discharge. Head: Atraumatic. ENT: No frontal and maxillary sinus tenderness.      Ears: Tympanic membranes pearly gray with good landmarks. No discharge.      Nose: No congestion/rhinnorhea.      Mouth/Throat: Mucous membranes are moist. Oropharynx non-erythematous. Tonsils not enlarged. No exudates. Uvula  midline. Neck: No stridor.   Hematological/Lymphatic/Immunilogical: No cervical lymphadenopathy. Cardiovascular: Normal rate, regular rhythm.  Good peripheral circulation. Respiratory: Normal respiratory effort without tachypnea or retractions. Lungs CTAB. Good air entry to the bases with no decreased or absent breath sounds. Gastrointestinal: Bowel sounds 4 quadrants. Soft and nontender to palpation. No guarding or rigidity. No palpable masses. No distention. Musculoskeletal: Full range of motion to all extremities. No gross deformities appreciated. Neurologic:  Normal speech and language. No gross focal neurologic deficits are appreciated.  Skin:  Skin is warm, dry and intact. No rash noted. Psychiatric: Mood and affect are normal. Speech and behavior are normal. Patient exhibits appropriate insight and judgement.   ____________________________________________   LABS (all labs ordered are listed, but only abnormal results are displayed)  Labs Reviewed - No data to display ____________________________________________  EKG   ____________________________________________  RADIOLOGY Lexine Baton, personally viewed and evaluated these images (plain radiographs) as part of my medical decision making, as well as reviewing the written report by the radiologist.  Dg Chest 2 View  Result Date: 04/06/2018 CLINICAL DATA:  Cough for the past week.  Smoker. EXAM: CHEST - 2 VIEW COMPARISON:  None. FINDINGS: Normal sized heart. Clear lungs. The lungs are mildly hyperexpanded with mild peribronchial thickening. Minimal thoracic spine degenerative changes. IMPRESSION: Mild changes of COPD and chronic bronchitis. Electronically Signed   By: Beckie Salts M.D.   On: 04/06/2018 10:25    ____________________________________________    PROCEDURES  Procedure(s) performed:    Procedures    Medications  ipratropium-albuterol (DUONEB) 0.5-2.5 (3) MG/3ML nebulizer solution 3 mL (3 mLs  Nebulization Given 04/06/18 0949)  methylPREDNISolone sodium succinate (SOLU-MEDROL) 125 mg/2 mL injection 125 mg (125 mg Intramuscular Given 04/06/18 1105)     ____________________________________________   INITIAL IMPRESSION / ASSESSMENT AND PLAN / ED COURSE  Pertinent labs & imaging results that were available during my care of the patient were reviewed by me and considered in my medical decision making (see chart for details).  Review of the [place] CSRS was performed in accordance of the [facility] prior to dispensing any controlled drugs.   Patient's diagnosis is consistent with bronchitis and COPD. Vital signs and exam are reassuring. Chest xray consistent with mild changes of COPD and chronic bronchitis. Xray results were discussed with patient.  Patient recalls being told she has COPD in the past. She felt better after duoneb. IM solumedrol was given. She appears well and is not in any respiratory distress. Education about smoking cessation was provided. Resources were given. Patient feels comfortable going home. Patient will be discharged home with prescriptions for albuterol inhaler, prednisone, tessalon perles. Patient is to follow up with PCP as needed or otherwise directed. Patient is given ED precautions to return to the ED for any worsening or new  symptoms.     ____________________________________________  FINAL CLINICAL IMPRESSION(S) / ED DIAGNOSES  Final diagnoses:  Bronchitis  Chronic obstructive pulmonary disease, unspecified COPD type (HCC)      NEW MEDICATIONS STARTED DURING THIS VISIT:  ED Discharge Orders        Ordered    albuterol (PROVENTIL HFA;VENTOLIN HFA) 108 (90 Base) MCG/ACT inhaler  Every 6 hours PRN     04/06/18 1107    predniSONE (DELTASONE) 10 MG tablet     04/06/18 1107    benzonatate (TESSALON PERLES) 100 MG capsule  3 times daily PRN     04/06/18 1107          This chart was dictated using voice recognition software/Dragon. Despite best  efforts to proofread, errors can occur which can change the meaning. Any change was purely unintentional.    Enid DerryWagner, Demiyah Fischbach, PA-C 04/06/18 1151    Jeanmarie PlantMcShane, James A, MD 04/06/18 [phone]

## 2018-04-06 NOTE — ED Triage Notes (Signed)
Cough x 10 days.  No SOB/ DOE.  NAD

## 2018-04-06 NOTE — ED Notes (Signed)
Patient transported to X-ray 

## 2018-04-14 ENCOUNTER — Other Ambulatory Visit: Payer: Self-pay

## 2018-04-14 ENCOUNTER — Emergency Department: Payer: BLUE CROSS/BLUE SHIELD

## 2018-04-14 ENCOUNTER — Emergency Department
Admission: EM | Admit: 2018-04-14 | Discharge: 2018-04-14 | Disposition: A | Discharge status: Home / Self Care | Payer: BLUE CROSS/BLUE SHIELD | Attending: Emergency Medicine | Admitting: Emergency Medicine

## 2018-04-14 DIAGNOSIS — Y939 Activity, unspecified: Secondary | ICD-10-CM | POA: Insufficient documentation

## 2018-04-14 DIAGNOSIS — Y999 Unspecified external cause status: Secondary | ICD-10-CM | POA: Diagnosis not present

## 2018-04-14 DIAGNOSIS — J45909 Unspecified asthma, uncomplicated: Secondary | ICD-10-CM | POA: Insufficient documentation

## 2018-04-14 DIAGNOSIS — Y929 Unspecified place or not applicable: Secondary | ICD-10-CM | POA: Insufficient documentation

## 2018-04-14 DIAGNOSIS — S62141A Displaced fracture of body of hamate [unciform] bone, right wrist, initial encounter for closed fracture: Secondary | ICD-10-CM | POA: Diagnosis not present

## 2018-04-14 DIAGNOSIS — S62316A Displaced fracture of base of fifth metacarpal bone, right hand, initial encounter for closed fracture: Secondary | ICD-10-CM

## 2018-04-14 DIAGNOSIS — W2209XA Striking against other stationary object, initial encounter: Secondary | ICD-10-CM | POA: Insufficient documentation

## 2018-04-14 DIAGNOSIS — S6991XA Unspecified injury of right wrist, hand and finger(s), initial encounter: Secondary | ICD-10-CM | POA: Diagnosis present

## 2018-04-14 DIAGNOSIS — F17200 Nicotine dependence, unspecified, uncomplicated: Secondary | ICD-10-CM | POA: Insufficient documentation

## 2018-04-14 DIAGNOSIS — S62314A Displaced fracture of base of fourth metacarpal bone, right hand, initial encounter for closed fracture: Secondary | ICD-10-CM | POA: Insufficient documentation

## 2018-04-14 MED ORDER — OXYCODONE-ACETAMINOPHEN 5-325 MG PO TABS
1.0000 | ORAL_TABLET | Freq: Once | ORAL | Status: AC
  Administered 2018-04-14: 1 via ORAL
  Filled 2018-04-14: qty 1

## 2018-04-14 MED ORDER — ONDANSETRON 8 MG PO TBDP
8.0000 mg | ORAL_TABLET | Freq: Once | ORAL | Status: AC
  Administered 2018-04-14: 8 mg via ORAL
  Filled 2018-04-14: qty 1

## 2018-04-14 MED ORDER — ONDANSETRON 4 MG PO TBDP: 4.0000 mg | ORAL_TABLET | Freq: Three times a day (TID) | ORAL | 0 refills | Status: DC | PRN

## 2018-04-14 MED ORDER — HYDROCODONE-ACETAMINOPHEN 5-325 MG PO TABS: 1.0000 | ORAL_TABLET | ORAL | 0 refills | Status: DC | PRN

## 2018-04-14 NOTE — ED Notes (Signed)
X-ray at bedside

## 2018-04-14 NOTE — ED Triage Notes (Signed)
Patient to ED for right hand injury after punching a wall. Patient states she struck the wall out of anger during an argument.

## 2018-04-14 NOTE — ED Provider Notes (Signed)
[facility] Emergency Department Provider Note  ____________________________________________  Time seen: Approximately 11:39 PM  I have reviewed the triage vital signs and the nursing notes.   HISTORY  Chief Complaint Hand Injury    HPI Ann Gray is a 27 y.o. female who presents the emergency department for pain, swelling, palpable abnormality to the right hand.  Patient reports that she became upset, punched a wall with a closed hand.  Patient is reporting pain to the dorsal aspect of the hand.  She reports that while she palpates the dorsal aspect, she can feel a "moving abnormality."  Patient denies any numbness and tingling to the right hand.  No history of previous injury to the hand.  Patient has limited range of motion to the digits and the wrist due to pain.  Medications prior to arrival.  No other complaints at this time.  Past Medical History:  Diagnosis Date  . Asthma   . Murmur, cardiac     There are no active problems to display for this patient.   History reviewed. No pertinent surgical history.  Prior to Admission medications   Medication Sig Start Date End Date Taking? Authorizing Provider  albuterol (PROVENTIL HFA;VENTOLIN HFA) 108 (90 Base) MCG/ACT inhaler Inhale 2 puffs into the lungs every 6 (six) hours as needed for wheezing or shortness of breath. 04/06/18   Enid Derry, PA-C  azithromycin (ZITHROMAX Z-PAK) 250 MG tablet 2 pills today then 1 pill a day for 4 days 11/14/17   Sherrie Mustache Roselyn Bering, PA-C  benzonatate (TESSALON PERLES) 100 MG capsule Take 1 capsule (100 mg total) by mouth 3 (three) times daily as needed for cough. 04/06/18 04/06/19  Enid Derry, PA-C  HYDROcodone-acetaminophen (NORCO/VICODIN) 5-325 MG tablet Take 1 tablet by mouth every 4 (four) hours as needed for moderate pain. 04/14/18   Shirle Provencal, Delorise Royals, PA-C  methylPREDNISolone (MEDROL DOSEPAK) 4 MG TBPK tablet Take 6 pills on day one then decrease by 1 pill  each day 11/14/17   Faythe Ghee, PA-C  ondansetron (ZOFRAN-ODT) 4 MG disintegrating tablet Take 1 tablet (4 mg total) by mouth every 8 (eight) hours as needed for nausea or vomiting. 04/14/18   Claudia Alvizo, Delorise Royals, PA-C  predniSONE (DELTASONE) 10 MG tablet Take 6 tablets on day 1, take 5 tablets on day 2, take 4 tablets on day 3, take 3 tablets on day 4, take 2 tablets on day 5, take 1 tablet on day 6 04/06/18   Enid Derry, PA-C    Allergies Septra [sulfamethoxazole-trimethoprim] and Sulfa antibiotics  No family history on file.  Social History Social History   Tobacco Use  . Smoking status: Current Every Day Smoker  . Smokeless tobacco: Never Used  Substance Use Topics  . Alcohol use: No  . Drug use: Not on file     Review of Systems  Constitutional: No fever/chills Eyes: No visual changes.  Cardiovascular: no chest pain. Respiratory: no cough. No SOB. Musculoskeletal: Positive for pain and injury to the right hand Skin: Negative for rash, abrasions, lacerations, ecchymosis. Neurological: Negative for headaches, focal weakness or numbness. 10-point ROS otherwise negative.  ____________________________________________   PHYSICAL EXAM:  VITAL SIGNS: ED Triage Vitals  Enc Vitals Group     BP 04/14/18 2144 117/83     Pulse Rate 04/14/18 2144 97     Resp 04/14/18 2144 18     Temp 04/14/18 2144 97.7 F (36.5 C)     Temp Source 04/14/18 2144 Oral  SpO2 04/14/18 2144 100 %     Weight 04/14/18 2146 145 lb (65.8 kg)     Height 04/14/18 2146  (1.651 m)     Head Circumference --      Peak Flow --      Pain Score 04/14/18 2145 5     Pain Loc --      Pain Edu? --      Excl. in GC? --      Constitutional: Alert and oriented. Well appearing and in no acute distress. Eyes: Conjunctivae are normal. PERRL. EOMI. Head: Atraumatic.  Neck: No stridor.    Cardiovascular: Normal rate, regular rhythm. Normal S1 and S2.  Good peripheral circulation. Respiratory:  Normal respiratory effort without tachypnea or retractions. Lungs CTAB. Good air entry to the bases with no decreased or absent breath sounds. Musculoskeletal: Full range of motion to all extremities. No gross deformities appreciated.  Edema, deformity noted to the right dorsal hand.  Patient has edema and erythema from the fourth and fifth MCP joint extending along the metacarpal bones.  This is accompanied with a deformity just proximal to the metacarpal region.  Patient is extremely tender to palpation along the fourth, fifth metacarpals.  Patient has a palpable abnormality over the carpal bones.  Patient is able to extend and flex all digits.  Sensation intact all 5 digits.  Capillary refill intact all 5 digits. Neurologic:  Normal speech and language. No gross focal neurologic deficits are appreciated.  Skin:  Skin is warm, dry and intact. No rash noted. Psychiatric: Mood and affect are normal. Speech and behavior are normal. Patient exhibits appropriate insight and judgement.   ____________________________________________   LABS (all labs ordered are listed, but only abnormal results are displayed)  Labs Reviewed - No data to display ____________________________________________  EKG   ____________________________________________  RADIOLOGY Festus Barren Swathi Dauphin, personally viewed and evaluated these images (plain radiographs) as part of my medical decision making, as well as reviewing the written report by the radiologist.  I concur with radiologist finding of displaced fractures to the base of the fourth and fifth metacarpals and displaced hamate fracture.  Dg Hand Complete Right  Result Date: 04/14/2018 CLINICAL DATA:  Patient struck a wall with the right hand. Pain with redness to the right third and fourth knuckles. EXAM: RIGHT HAND - COMPLETE 3+ VIEW COMPARISON:  None. FINDINGS: Oblique linear fracture along the base of the fourth metacarpal bone. There appears to be dorsal  dislocation of the fourth and fifth metacarpal bones with respect to the carpus. There is a displaced carpal bone fracture fragment posteriorly in the dorsum of the wrist. This is probably arising from the hamate. Soft tissue swelling over the dorsum of the wrist and fifth metacarpal region. IMPRESSION: Fracture of the base of the fourth metacarpal bone with dorsal dislocation of the fourth and fifth metacarpal bones with respect to the carpus. Displaced carpal bone fracture fragment in the dorsum of the wrist probably arises from the hamate. Electronically Signed   By: Burman Nieves M.D.   On: 04/14/2018 22:42    ____________________________________________    PROCEDURES  Procedure(s) performed:    .Splint Application Date/Time: 04/14/2018 11:41 PM Performed by: Racheal Patches, PA-C Authorized by: Racheal Patches, PA-C   Consent:    Consent obtained:  Verbal   Consent given by:  Patient   Risks discussed:  Pain and swelling Pre-procedure details:    Sensation:  Normal Procedure details:    Laterality:  Right   Location:  Hand   Hand:  R hand   Splint type:  Ulnar gutter   Supplies:  Cotton padding, Ortho-Glass and elastic bandage Post-procedure details:    Pain:  Improved   Sensation:  Normal   Patient tolerance of procedure:  Tolerated well, no immediate complications      Medications  ondansetron (ZOFRAN-ODT) disintegrating tablet 8 mg (8 mg Oral Given 04/14/18 2316)  oxyCODONE-acetaminophen (PERCOCET/ROXICET) 5-325 MG per tablet 1 tablet (1 tablet Oral Given 04/14/18 2316)     ____________________________________________   INITIAL IMPRESSION / ASSESSMENT AND PLAN / ED COURSE  Pertinent labs & imaging results that were available during my care of the patient were reviewed by me and considered in my medical decision making (see chart for details).  Review of the [place] CSRS was performed in accordance of the [facility] prior to dispensing any controlled  drugs.     Patient's diagnosis is consistent with fractures to the fourth and fifth metacarpal base with mild displacement as well as a closed displaced fracture of the hamate.  Patient presents emergency department for her pain, swelling, palpable abnormality to the dorsal right hand after punching a wall.  On exam, findings consistent with possible fracture.  X-ray reveals the above diagnosis of fourth, fifth metacarpal bone fracture and fracture of the hamate.  Patient's hand is splinted as described above.  I have advised patient to perform no activity with the right upper extremity.  She will follow-up with hand surgery for further management.  Patient will be prescribed Vicodin and Zofran.. Patient is given ED precautions to return to the ED for any worsening or new symptoms.     ____________________________________________  FINAL CLINICAL IMPRESSION(S) / ED DIAGNOSES  Final diagnoses:  Closed displaced fracture of base of fourth metacarpal bone of right hand, initial encounter  Closed displaced fracture of base of fifth metacarpal bone of right hand, initial encounter  Closed displaced fracture of hamate bone of right wrist, unspecified portion of hamate, initial encounter      NEW MEDICATIONS STARTED DURING THIS VISIT:  ED Discharge Orders        Ordered    HYDROcodone-acetaminophen (NORCO/VICODIN) 5-325 MG tablet  Every 4 hours PRN     04/14/18 2330    ondansetron (ZOFRAN-ODT) 4 MG disintegrating tablet  Every 8 hours PRN     04/14/18 2330          This chart was dictated using voice recognition software/Dragon. Despite best efforts to proofread, errors can occur which can change the meaning. Any change was purely unintentional.    Racheal Patches, PA-C 04/14/18 Lehman Prom, MD 04/15/18 [phone]

## 2018-04-14 NOTE — ED Notes (Addendum)
Pt reports striking a wall with right hand at approx 2115, pt reports pain on palpation to fingers and when closing fist, CMS intact to distal fingers, redness to right 3rd and 4th knuckles, pt reports prior injury to right hand, pt reports anger "management issues"  Pt applying ice to area

## 2018-04-15 NOTE — Progress Notes (Signed)
[facility] Pulmonary Medicine Consultation      Assessment and Plan:  Asthma.  - Severe persistent, multiple recent exacerbations with ED visits, at high risk of recurrent exacerbation. - Discussed that cigarette smoking is a major contributor, as well as multiple known allergies. - We will start on inhaled steroid, as well as Singulair once daily.  Continue to use rescue inhaler as needed.   Nicotine abuse.  -Discussed importance of smoking cessation, especially in light of her severe asthma.  She is currently cutting down, and has a plan to quit along with her partner who is also present and committed to quitting.  Spent 3 minutes in discussion.  Orders Placed This Encounter  Procedures  . Pulmonary Function Test [facility] Only   Meds ordered this encounter  Medications  . umeclidinium bromide (INCRUSE ELLIPTA) 62.5 MCG/INH AEPB    Sig: Inhale 1 puff into the lungs daily.    Dispense:  1 each    Refill:  5  . montelukast (SINGULAIR) 10 MG tablet    Sig: Take 1 tablet (10 mg total) by mouth at bedtime.    Dispense:  30 tablet    Refill:  5   Return in about 3 months (around 07/16/2018).   Date: 04/16/2018  MRN# [id number] Ann Gray April 09, 1991    Ann Gray is a 27 y.o. old female seen in consultation for chief complaint of:    Chief Complaint  Patient presents with  . Hospitalization Follow-up    SOB w/activity: chest tightness: wheezing: coughing    HPI:   The patient is a 27 year old female smoker who had a ED visit earlier this month, as well as about 6 months ago with similar symptoms, shortness of breath with cough.  At her most recent visit it was noted that she had previously been on inhaler but ran out of it recently. She was seen in the ED and told that she had dyspnea. She had a CXR and told that she had COPD. She has dyspnea all year round, with heaviness in her chest.  They have 5 dogs, one usually sleeps in bed with her. 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 No known occupational exposures.   She has been on an inhalers for several years, she has been diagnosed with asthma since she was a baby. She does not have a PCP, she goes to the ED for her an albuterol inhaler because she does not have a copay that way.  She uses albuterol  2 puffs daily, tessalon one tablet daily and it helps daily. She takes no antihistamine or other medications for asthma. She has been tested for allergies which were multiple including pollen, dust, grass, animal dander.  She denies reflux. She does have sinus drainage.  She is currently smoking 4 cigs per day, she was smoking 1.5 ppd, cut down when she went to the ED and was told that she had COPD.   **Imaging personally reviewed, chest x-ray 04/06/2018; normal lungs.   PMHX:   Past Medical History:  Diagnosis Date  . Asthma   . Murmur, cardiac    Surgical Hx:  History reviewed. No pertinent surgical history. Family Hx:  History reviewed. No pertinent family history. Social Hx:   Social History   Tobacco Use  . Smoking status: Current Every Day Smoker    Packs/day: 0.25  . Smokeless tobacco: Never Used  Substance Use Topics  . Alcohol use: No  . Drug use: Not on file  Medication:    Current Outpatient Medications:  .  albuterol (PROVENTIL HFA;VENTOLIN HFA) 108 (90 Base) MCG/ACT inhaler, Inhale 2 puffs into the lungs every 6 (six) hours as needed for wheezing or shortness of breath., Disp: 1 Inhaler, Rfl: 0 .  benzonatate (TESSALON PERLES) 100 MG capsule, Take 1 capsule (100 mg total) by mouth 3 (three) times daily as needed for cough., Disp: 30 capsule, Rfl: 0 .  HYDROcodone-acetaminophen (NORCO/VICODIN) 5-325 MG tablet, Take 1 tablet by mouth every 4 (four) hours as needed for moderate pain., Disp: 20 tablet, Rfl: 0 .  ondansetron (ZOFRAN-ODT) 4 MG disintegrating tablet, Take 1 tablet (4 mg total) by mouth every 8 (eight) hours as needed for nausea or vomiting., Disp: 20 tablet, Rfl:  0   Allergies:  Septra [sulfamethoxazole-trimethoprim] and Sulfa antibiotics  Review of Systems: Gen:  Denies  fever, sweats, chills HEENT: Denies blurred vision, double vision. bleeds, sore throat Cvc:  No dizziness, chest pain. Resp:   Denies cough or sputum production, shortness of breath Gi: Denies swallowing difficulty, stomach pain. Gu:  Denies bladder incontinence, burning urine Ext:   No Joint pain, stiffness. Skin: No skin rash,  hives  Endoc:  No polyuria, polydipsia. Psych: No depression, insomnia. Other:  All other systems were reviewed with the patient and were negative other that what is mentioned in the HPI.   Physical Examination:   VS: BP 120/78 (BP Location: Left Arm, Cuff Size: Normal)   Pulse 75   Ht  (1.651 m)   Wt 147 lb (66.7 kg)   LMP 03/29/2018   SpO2 100%   BMI 24.46 kg/m   General Appearance: No distress  Neuro:without focal findings,  speech normal,  HEENT: PERRLA, EOM intact.   Pulmonary: normal breath sounds, No wheezing.  CardiovascularNormal S1,S2.  No m/r/g.   Abdomen: Benign, Soft, non-tender. Renal:  No costovertebral tenderness  GU:  No performed at this time. Endoc: No evident thyromegaly, no signs of acromegaly. Skin:   warm, no rashes, no ecchymosis  Extremities: normal, no cyanosis, clubbing.  Right upper extremity cast in place  Other findings:    LABORATORY PANEL:   CBC No results for input(s): WBC, HGB, HCT, PLT in the last 168 hours. ------------------------------------------------------------------------------------------------------------------  Chemistries  No results for input(s): NA, K, CL, CO2, GLUCOSE, BUN, CREATININE, CALCIUM, MG, AST, ALT, ALKPHOS, BILITOT in the last 168 hours.  Invalid input(s): GFRCGP ------------------------------------------------------------------------------------------------------------------  Cardiac Enzymes No results for input(s): TROPONINI in the last 168  hours. ------------------------------------------------------------  RADIOLOGY:  Dg Hand Complete Right  Result Date: 04/14/2018 CLINICAL DATA:  Patient struck a wall with the right hand. Pain with redness to the right third and fourth knuckles. EXAM: RIGHT HAND - COMPLETE 3+ VIEW COMPARISON:  None. FINDINGS: Oblique linear fracture along the base of the fourth metacarpal bone. There appears to be dorsal dislocation of the fourth and fifth metacarpal bones with respect to the carpus. There is a displaced carpal bone fracture fragment posteriorly in the dorsum of the wrist. This is probably arising from the hamate. Soft tissue swelling over the dorsum of the wrist and fifth metacarpal region. IMPRESSION: Fracture of the base of the fourth metacarpal bone with dorsal dislocation of the fourth and fifth metacarpal bones with respect to the carpus. Displaced carpal bone fracture fragment in the dorsum of the wrist probably arises from the hamate. Electronically Signed   By: Burman Nieves M.D.   On: 04/14/2018 22:42       Thank  you  for the consultation and for allowing [facility] to assist in the care of your patient. Our recommendations are noted above.  Please contact us if we can be of further service.   Wells Guiles, MD.  Board Certified in Internal Medicine, Pulmonary Medicine, Critical Care Medicine, and Sleep Medicine.  [facility] Office Number: [phone]  Santiago Glad, M.D.  Billy Fischer, M.D  04/16/2018

## 2018-04-16 ENCOUNTER — Other Ambulatory Visit: Payer: Self-pay | Admitting: Orthopaedic Surgery

## 2018-04-16 ENCOUNTER — Ambulatory Visit (INDEPENDENT_AMBULATORY_CARE_PROVIDER_SITE_OTHER): Payer: BLUE CROSS/BLUE SHIELD | Admitting: Internal Medicine

## 2018-04-16 ENCOUNTER — Encounter: Payer: Self-pay | Admitting: Internal Medicine

## 2018-04-16 ENCOUNTER — Ambulatory Visit
Admission: RE | Admit: 2018-04-16 | Discharge: 2018-04-16 | Disposition: A | Discharge status: Home / Self Care | Payer: BLUE CROSS/BLUE SHIELD | Source: Ambulatory Visit | Attending: Orthopaedic Surgery | Admitting: Orthopaedic Surgery

## 2018-04-16 VITALS — BP 120/78 | HR 75 | Ht 65.0 in | Wt 147.0 lb

## 2018-04-16 DIAGNOSIS — J4550 Severe persistent asthma, uncomplicated: Secondary | ICD-10-CM

## 2018-04-16 DIAGNOSIS — S62141A Displaced fracture of body of hamate [unciform] bone, right wrist, initial encounter for closed fracture: Secondary | ICD-10-CM | POA: Diagnosis not present

## 2018-04-16 DIAGNOSIS — Z720 Tobacco use: Secondary | ICD-10-CM

## 2018-04-16 DIAGNOSIS — S62314A Displaced fracture of base of fourth metacarpal bone, right hand, initial encounter for closed fracture: Secondary | ICD-10-CM | POA: Diagnosis not present

## 2018-04-16 DIAGNOSIS — S63264A Dislocation of metacarpophalangeal joint of right ring finger, initial encounter: Secondary | ICD-10-CM | POA: Insufficient documentation

## 2018-04-16 DIAGNOSIS — X58XXXA Exposure to other specified factors, initial encounter: Secondary | ICD-10-CM | POA: Insufficient documentation

## 2018-04-16 DIAGNOSIS — S62324A Displaced fracture of shaft of fourth metacarpal bone, right hand, initial encounter for closed fracture: Secondary | ICD-10-CM

## 2018-04-16 DIAGNOSIS — S63266A Dislocation of metacarpophalangeal joint of right little finger, initial encounter: Secondary | ICD-10-CM | POA: Insufficient documentation

## 2018-04-16 MED ORDER — UMECLIDINIUM BROMIDE 62.5 MCG/INH IN AEPB: 1.0000 | INHALATION_SPRAY | Freq: Every day | RESPIRATORY_TRACT | 0 refills | Status: DC

## 2018-04-16 MED ORDER — MONTELUKAST SODIUM 10 MG PO TABS: 10.0000 mg | ORAL_TABLET | Freq: Every day | ORAL | 5 refills | Status: DC

## 2018-04-16 MED ORDER — UMECLIDINIUM BROMIDE 62.5 MCG/INH IN AEPB: 1.0000 | INHALATION_SPRAY | Freq: Every day | RESPIRATORY_TRACT | 5 refills | Status: DC

## 2018-04-16 NOTE — Addendum Note (Signed)
Addended by: Janean Sark on: 04/16/2018 10:45 AM   Modules accepted: Orders

## 2018-04-16 NOTE — Patient Instructions (Addendum)
Will send you for a lung function test.  Will start on Incruse inhaler once daily.  Will start singulair once every night.

## 2018-07-02 ENCOUNTER — Telehealth: Payer: Self-pay | Admitting: *Deleted

## 2018-07-02 ENCOUNTER — Ambulatory Visit: Payer: BLUE CROSS/BLUE SHIELD | Attending: Internal Medicine

## 2018-07-02 NOTE — Telephone Encounter (Signed)
Pt no-showed appt for PFT.

## 2018-07-10 ENCOUNTER — Ambulatory Visit: Payer: BLUE CROSS/BLUE SHIELD | Admitting: Internal Medicine

## 2019-01-20 ENCOUNTER — Emergency Department: Payer: BLUE CROSS/BLUE SHIELD

## 2019-01-20 ENCOUNTER — Emergency Department
Admission: EM | Admit: 2019-01-20 | Discharge: 2019-01-20 | Disposition: A | Discharge status: Home / Self Care | Payer: BLUE CROSS/BLUE SHIELD | Attending: Emergency Medicine | Admitting: Emergency Medicine

## 2019-01-20 ENCOUNTER — Other Ambulatory Visit: Payer: Self-pay

## 2019-01-20 ENCOUNTER — Encounter: Payer: Self-pay | Admitting: Emergency Medicine

## 2019-01-20 DIAGNOSIS — R079 Chest pain, unspecified: Secondary | ICD-10-CM | POA: Diagnosis not present

## 2019-01-20 DIAGNOSIS — J45909 Unspecified asthma, uncomplicated: Secondary | ICD-10-CM | POA: Insufficient documentation

## 2019-01-20 DIAGNOSIS — Z79899 Other long term (current) drug therapy: Secondary | ICD-10-CM | POA: Insufficient documentation

## 2019-01-20 DIAGNOSIS — F17200 Nicotine dependence, unspecified, uncomplicated: Secondary | ICD-10-CM | POA: Insufficient documentation

## 2019-01-20 HISTORY — DX: Chronic obstructive pulmonary disease, unspecified: J44.9

## 2019-01-20 LAB — BASIC METABOLIC PANEL
Anion gap: 6 (ref 5–15)
BUN: 11 mg/dL (ref 6–20)
CO2: 29 mmol/L (ref 22–32)
CREATININE: 0.68 mg/dL (ref 0.44–1.00)
Calcium: 9.7 mg/dL (ref 8.9–10.3)
Chloride: 107 mmol/L (ref 98–111)
GFR calc Af Amer: >60 mL/min (ref >60)
GFR calc non Af Amer: >60 mL/min (ref >60)
GLUCOSE: 117 mg/dL — AB (ref 70–99)
Potassium: 3.7 mmol/L (ref 3.5–5.1)
Sodium: 142 mmol/L (ref 135–145)

## 2019-01-20 LAB — CBC
HEMATOCRIT: 41 % (ref 36.0–46.0)
Hemoglobin: 13.7 g/dL (ref 12.0–15.0)
MCH: 32.2 pg (ref 26.0–34.0)
MCHC: 33.4 g/dL (ref 30.0–36.0)
MCV: 96.2 fL (ref 80.0–100.0)
Platelets: 228 10*3/uL (ref 150–400)
RBC: 4.26 MIL/uL (ref 3.87–5.11)
RDW: 13.1 % (ref 11.5–15.5)
WBC: 10.1 10*3/uL (ref 4.0–10.5)
nRBC: 0 % (ref 0.0–0.2)

## 2019-01-20 LAB — TROPONIN I
Troponin I: <0.03 ng/mL (ref <0.03)
Troponin I: <0.03 ng/mL (ref <0.03)

## 2019-01-20 LAB — POCT PREGNANCY, URINE: Preg Test, Ur: NEGATIVE

## 2019-01-20 MED ORDER — IOHEXOL 350 MG/ML SOLN
75.0000 mL | Freq: Once | INTRAVENOUS | Status: AC | PRN
  Administered 2019-01-20: 75 mL via INTRAVENOUS

## 2019-01-20 NOTE — ED Triage Notes (Signed)
Patient with complaint of central chest pain with shortness of breath that started about an hour ago.

## 2019-01-20 NOTE — ED Notes (Signed)
ED Provider at bedside. 

## 2019-01-20 NOTE — ED Provider Notes (Signed)
-----------------------------------------   9:08 AM on 01/20/2019 ----------------------------------------- Assumed care from Dr. Manson Passey at 7:00 AM to follow-up on troponin and CT scan.  Both are overall unremarkable.  Serial troponin negative.  CT scan shows no signs of PE or acute lung disease.  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  I discussed this with the patient and its association to congenital heart disease.  Recommend she follow-up with cardiology for further evaluation of this finding and any additional work-up.  Currently she is calm comfortable and eager to go home.  Doubt ACS dissection pericarditis.  No signs of pneumonia pneumothorax or pulmonary embolism.   Sharman Cheek, MD 01/20/19 [phone]

## 2019-01-20 NOTE — ED Notes (Signed)
MD at bedside. 

## 2019-01-20 NOTE — ED Notes (Signed)
Pt taken to CT.

## 2019-01-20 NOTE — ED Provider Notes (Signed)
[facility] Emergency Department Provider Note   First MD Initiated Contact with Patient 01/20/19 [phone]     (approximate)  I have reviewed the triage vital signs and the nursing notes.   HISTORY  Chief Complaint Chest Pain    HPI Ann Gray is a 28 y.o. female with below list of previous medical conditions presents to the emergency department with right central nonradiating chest pain which patient states began 1 hour before arrival to the emergency department.  Patient states that she is experienced this discomfort in the past however it has never lasted this long".  Patient states that the pain is worse with deep inspiration.  Patient also admits to right leg pain a week ago.  Patient denies any personal familial history of DVT or PE.  Patient does admit to half a pack cigarette use daily.  Patient denies any cough no fever.   Past Medical History:  Diagnosis Date  . Asthma   . COPD (chronic obstructive pulmonary disease) (HCC)   . Murmur, cardiac     There are no active problems to display for this patient.   Past Surgical History:  Procedure Laterality Date  . TONSILLECTOMY      Prior to Admission medications   Medication Sig Start Date End Date Taking? Authorizing Provider  albuterol (PROVENTIL HFA;VENTOLIN HFA) 108 (90 Base) MCG/ACT inhaler Inhale 2 puffs into the lungs every 6 (six) hours as needed for wheezing or shortness of breath. 04/06/18   Enid Derry, PA-C  benzonatate (TESSALON PERLES) 100 MG capsule Take 1 capsule (100 mg total) by mouth 3 (three) times daily as needed for cough. 04/06/18 04/06/19  Enid Derry, PA-C  HYDROcodone-acetaminophen (NORCO/VICODIN) 5-325 MG tablet Take 1 tablet by mouth every 4 (four) hours as needed for moderate pain. 04/14/18   Cuthriell, Delorise Royals, PA-C  montelukast (SINGULAIR) 10 MG tablet Take 1 tablet (10 mg total) by mouth at bedtime. 04/16/18   Shane Crutch, MD  ondansetron  (ZOFRAN-ODT) 4 MG disintegrating tablet Take 1 tablet (4 mg total) by mouth every 8 (eight) hours as needed for nausea or vomiting. 04/14/18   Cuthriell, Delorise Royals, PA-C  umeclidinium bromide (INCRUSE ELLIPTA) 62.5 MCG/INH AEPB Inhale 1 puff into the lungs daily. 04/16/18   Shane Crutch, MD    Allergies Septra [sulfamethoxazole-trimethoprim] and Sulfa antibiotics  No family history on file.  Social History Social History   Tobacco Use  . Smoking status: Current Every Day Smoker    Packs/day: 0.25  . Smokeless tobacco: Never Used  Substance Use Topics  . Alcohol use: Yes    Comment: occ  . Drug use: Not Currently    Review of Systems Constitutional: No fever/chills Eyes: No visual changes. ENT: No sore throat. Cardiovascular: Positive for hest pain. Respiratory: Positive for shortness of breath. Gastrointestinal: No abdominal pain.  No nausea, no vomiting.  No diarrhea.  No constipation. Genitourinary: Negative for dysuria. Musculoskeletal: Negative for neck pain.  Negative for back pain. Integumentary: Negative for rash. Neurological: Negative for headaches, focal weakness or numbness.   ____________________________________________   PHYSICAL EXAM:  VITAL SIGNS: ED Triage Vitals  Enc Vitals Group     BP 01/20/19 0259 (!) 135/100     Pulse Rate 01/20/19 0259 (!) 108     Resp 01/20/19 0259 20     Temp 01/20/19 0259 98 F (36.7 C)     Temp Source 01/20/19 0259 Oral     SpO2 01/20/19 0259 100 %  Weight 01/20/19 0300 83.9 kg (185 lb)     Height 01/20/19 0300 1.651 m (5\' 5" )     Head Circumference --      Peak Flow --      Pain Score 01/20/19 0308 6     Pain Loc --      Pain Edu? --      Excl. in GC? --     Constitutional: Alert and oriented. Well appearing and in no acute distress. Eyes: Conjunctivae are normal.  Mouth/Throat: Mucous membranes are moist.  Oropharynx non-erythematous. Neck: No stridor.   Cardiovascular: Normal rate, regular rhythm.  Good peripheral circulation. Grossly normal heart sounds. Respiratory: Normal respiratory effort.  No retractions. Lungs CTAB. Gastrointestinal: Soft and nontender. No distention.  Musculoskeletal: No lower extremity tenderness nor edema. No gross deformities of extremities. Neurologic:  Normal speech and language. No gross focal neurologic deficits are appreciated.  Skin:  Skin is warm, dry and intact. No rash noted. Psychiatric: Mood and affect are normal. Speech and behavior are normal.  ____________________________________________   LABS (all labs ordered are listed, but only abnormal results are displayed)  Labs Reviewed  BASIC METABOLIC PANEL - Abnormal; Notable for the following components:      Result Value   Glucose, Bld 117 (*)    All other components within normal limits  CBC  TROPONIN I  TROPONIN I  POC URINE PREG, ED   ____________________________________________  EKG  ED ECG REPORT I, [place] Lynnelle Mesmer, the attending physician, personally viewed and interpreted this ECG.   Date: 01/20/2019  EKG Time: 3:01 PM  Rate: 100  Rhythm: Normal sinus rhythm  Axis: Normal  Intervals: Normal  ST&T Change: None  ____________________________________________  RADIOLOGY I, [place] N Yulian Gosney, personally viewed and evaluated these images (plain radiographs) as part of my medical decision making, as well as reviewing the written report by the radiologist.  ED MD interpretation: No active cardiopulmonary disease noted on chest x-ray per radiologist  Official radiology report(s): Dg Chest 2 View  Result Date: 01/20/2019 CLINICAL DATA:  Central chest pain and shortness of breath starting about 1 hour ago. EXAM: CHEST - 2 VIEW COMPARISON:  04/06/2018 FINDINGS: The heart size and mediastinal contours are within normal limits. Both lungs are clear. The visualized skeletal structures are unremarkable. IMPRESSION: No active cardiopulmonary disease. Electronically Signed   By: Burman NievesWilliam   Stevens M.D.   On: 01/20/2019 03:34      Procedures   ____________________________________________   INITIAL IMPRESSION / ASSESSMENT AND PLAN / ED COURSE  As part of my medical decision making, I reviewed the following data within the electronic MEDICAL RECORD NUMBER 28 year old female presenting with above-stated history and physical exam secondary to chest pain and dyspnea.  Considered possibility of CAD however EKG negative will obtain a second troponin first troponin negative.  Also considered possibility of pulmonary emboli and as such CT angiogram will be performed.  Patient's care transferred to Dr. Scotty CourtStafford if CT angiogram repeat troponin negative I suspect the patient can be discharged home with outpatient follow-up  ____________________________________________  FINAL CLINICAL IMPRESSION(S) / ED DIAGNOSES  Final diagnoses:  Chest pain, unspecified type     MEDICATIONS GIVEN DURING THIS VISIT:  Medications - No data to display   ED Discharge Orders    None       Note:  This document was prepared using Dragon voice recognition software and may include unintentional dictation errors.    Darci CurrentBrown, [place] N, MD 01/20/19 [phone]

## 2019-01-20 NOTE — ED Notes (Signed)
Pt ambulatory to toilet to collect urine sample.  

## 2019-01-21 DIAGNOSIS — Z720 Tobacco use: Secondary | ICD-10-CM | POA: Insufficient documentation

## 2019-01-21 DIAGNOSIS — R011 Cardiac murmur, unspecified: Secondary | ICD-10-CM | POA: Insufficient documentation

## 2019-01-21 DIAGNOSIS — R0602 Shortness of breath: Secondary | ICD-10-CM | POA: Insufficient documentation

## 2019-01-21 DIAGNOSIS — R079 Chest pain, unspecified: Secondary | ICD-10-CM | POA: Insufficient documentation

## 2019-01-21 DIAGNOSIS — Q2547 Right aortic arch: Secondary | ICD-10-CM | POA: Insufficient documentation

## 2019-04-01 ENCOUNTER — Ambulatory Visit: Payer: BLUE CROSS/BLUE SHIELD | Admitting: Cardiovascular Disease

## 2019-04-17 ENCOUNTER — Other Ambulatory Visit: Payer: Self-pay | Admitting: Internal Medicine

## 2019-05-28 ENCOUNTER — Other Ambulatory Visit: Payer: Self-pay

## 2019-05-28 DIAGNOSIS — G8929 Other chronic pain: Secondary | ICD-10-CM | POA: Insufficient documentation

## 2019-05-28 DIAGNOSIS — J45909 Unspecified asthma, uncomplicated: Secondary | ICD-10-CM | POA: Diagnosis not present

## 2019-05-28 DIAGNOSIS — Y929 Unspecified place or not applicable: Secondary | ICD-10-CM | POA: Insufficient documentation

## 2019-05-28 DIAGNOSIS — M546 Pain in thoracic spine: Secondary | ICD-10-CM | POA: Diagnosis not present

## 2019-05-28 DIAGNOSIS — F17200 Nicotine dependence, unspecified, uncomplicated: Secondary | ICD-10-CM | POA: Diagnosis not present

## 2019-05-28 DIAGNOSIS — X58XXXA Exposure to other specified factors, initial encounter: Secondary | ICD-10-CM | POA: Diagnosis not present

## 2019-05-28 DIAGNOSIS — S299XXA Unspecified injury of thorax, initial encounter: Secondary | ICD-10-CM | POA: Diagnosis present

## 2019-05-28 DIAGNOSIS — Y939 Activity, unspecified: Secondary | ICD-10-CM | POA: Diagnosis not present

## 2019-05-28 DIAGNOSIS — S29019A Strain of muscle and tendon of unspecified wall of thorax, initial encounter: Secondary | ICD-10-CM | POA: Insufficient documentation

## 2019-05-28 DIAGNOSIS — J449 Chronic obstructive pulmonary disease, unspecified: Secondary | ICD-10-CM | POA: Insufficient documentation

## 2019-05-28 DIAGNOSIS — Y999 Unspecified external cause status: Secondary | ICD-10-CM | POA: Diagnosis not present

## 2019-05-28 DIAGNOSIS — Z79899 Other long term (current) drug therapy: Secondary | ICD-10-CM | POA: Diagnosis not present

## 2019-05-29 ENCOUNTER — Emergency Department
Admission: EM | Admit: 2019-05-29 | Discharge: 2019-05-29 | Disposition: A | Discharge status: Home / Self Care | Payer: BC Managed Care – PPO | Attending: Emergency Medicine | Admitting: Emergency Medicine

## 2019-05-29 ENCOUNTER — Encounter: Payer: Self-pay | Admitting: *Deleted

## 2019-05-29 ENCOUNTER — Other Ambulatory Visit: Payer: Self-pay

## 2019-05-29 DIAGNOSIS — G8929 Other chronic pain: Secondary | ICD-10-CM

## 2019-05-29 DIAGNOSIS — S29019A Strain of muscle and tendon of unspecified wall of thorax, initial encounter: Secondary | ICD-10-CM

## 2019-05-29 MED ORDER — LIDOCAINE 5 % EX PTCH
1.0000 | MEDICATED_PATCH | Freq: Once | CUTANEOUS | Status: DC
  Administered 2019-05-29: 04:00:00 1 via TRANSDERMAL
  Filled 2019-05-29: qty 1

## 2019-05-29 MED ORDER — IBUPROFEN 800 MG PO TABS: 800.0000 mg | ORAL_TABLET | Freq: Once | ORAL | Status: DC

## 2019-05-29 MED ORDER — KETOROLAC TROMETHAMINE 30 MG/ML IJ SOLN
30.0000 mg | Freq: Once | INTRAMUSCULAR | Status: DC
  Filled 2019-05-29: qty 1

## 2019-05-29 MED ORDER — CYCLOBENZAPRINE HCL 5 MG PO TABS: ORAL_TABLET | ORAL | 0 refills | Status: DC

## 2019-05-29 MED ORDER — LIDOCAINE 5 % EX PTCH: 1.0000 | MEDICATED_PATCH | CUTANEOUS | 0 refills | Status: DC

## 2019-05-29 NOTE — ED Triage Notes (Signed)
Pt has mid back pain.  Sx for 1 year.  Pain worse today  Pt alert.

## 2019-05-29 NOTE — ED Provider Notes (Signed)
[facility] Emergency Department Provider Note   ____________________________________________   First MD Initiated Contact with Patient 05/29/19 0302     (approximate)  I have reviewed the triage vital signs and the nursing notes.   HISTORY  Chief Complaint Back Pain    HPI Ann Gray is a 28 y.o. female who presents to the ED from home with a chief complaint of acute on chronic back pain.  Patient has had thoracic mid back pain for the past year.  Pain was worse today.  Denies fall/injury/trauma.  Hurts to move and lift her arms.  Denies extremity weakness, numbness or tingling.  Denies associated fever, cough, chest pain, shortness of breath, abdominal pain, nausea or vomiting.  Denies recent travel or exposure to persons diagnosed with coronavirus.       Past Medical History:  Diagnosis Date  . Asthma   . COPD (chronic obstructive pulmonary disease) (HCC)   . Murmur, cardiac     There are no active problems to display for this patient.   Past Surgical History:  Procedure Laterality Date  . TONSILLECTOMY      Prior to Admission medications   Medication Sig Start Date End Date Taking? Authorizing Provider  albuterol (PROVENTIL HFA;VENTOLIN HFA) 108 (90 Base) MCG/ACT inhaler Inhale 2 puffs into the lungs every 6 (six) hours as needed for wheezing or shortness of breath. 04/06/18   Enid DerryWagner, Ashley, PA-C  cyclobenzaprine (FLEXERIL) 5 MG tablet 1 tablet every 8 hours as he did for muscle spasms 05/29/19   Irean HongSung,  J, MD  lidocaine (LIDODERM) 5 % Place 1 patch onto the skin daily. Remove & Discard patch within 12 hours or as directed by MD 05/29/19   Irean HongSung,  J, MD  montelukast (SINGULAIR) 10 MG tablet Take 1 tablet (10 mg total) by mouth at bedtime. 04/16/18   Shane Crutchamachandran, Pradeep, MD    Allergies Sulfa antibiotics  No family history on file.  Social History Social History   Tobacco Use  . Smoking status: Current Every Day Smoker   Packs/day: 0.25  . Smokeless tobacco: Never Used  Substance Use Topics  . Alcohol use: Not Currently    Comment: occ  . Drug use: Not Currently    Review of Systems  Constitutional: No fever/chills Eyes: No visual changes. ENT: No sore throat. Cardiovascular: Denies chest pain. Respiratory: Denies shortness of breath. Gastrointestinal: No abdominal pain.  No nausea, no vomiting.  No diarrhea.  No constipation. Genitourinary: Negative for dysuria. Musculoskeletal: Positive for back pain. Skin: Negative for rash. Neurological: Negative for headaches, focal weakness or numbness.   ____________________________________________   PHYSICAL EXAM:  VITAL SIGNS: ED Triage Vitals  Enc Vitals Group     BP 05/29/19 0007 126/60     Pulse Rate 05/29/19 0007 75     Resp 05/29/19 0007 20     Temp 05/29/19 0007 98.5 F (36.9 C)     Temp Source 05/29/19 0007 Oral     SpO2 05/29/19 0007 99 %     Weight 05/29/19 0005 200 lb (90.7 kg)     Height 05/29/19 0005 5\' 5"  (1.651 m)     Head Circumference --      Peak Flow --      Pain Score 05/29/19 0005 2     Pain Loc --      Pain Edu? --      Excl. in GC? --     Constitutional: Alert and oriented. Well appearing and in  no acute distress. Eyes: Conjunctivae are normal. PERRL. EOMI. Head: Atraumatic. Nose: No congestion/rhinnorhea. Mouth/Throat: Mucous membranes are moist.  Oropharynx non-erythematous. Neck: No stridor.  No cervical spine tenderness to palpation. Cardiovascular: Normal rate, regular rhythm. Grossly normal heart sounds.  Good peripheral circulation. Respiratory: Normal respiratory effort.  No retractions. Lungs CTAB. Gastrointestinal: Soft and nontender. No distention. No abdominal bruits. No CVA tenderness. Musculoskeletal: No spinal tenderness to palpation.  Paraspinal thoracic muscle spasms bilaterally.  Pain on movement of trunk and lifting arms.  No lower extremity tenderness nor edema.  No joint effusions.  Neurologic:  Normal speech and language. No gross focal neurologic deficits are appreciated. No gait instability. Skin:  Skin is warm, dry and intact. No rash noted. Psychiatric: Mood and affect are normal. Speech and behavior are normal.  ____________________________________________   LABS (all labs ordered are listed, but only abnormal results are displayed)  Labs Reviewed - No data to display ____________________________________________  EKG  None ____________________________________________  RADIOLOGY  ED MD interpretation: None  Official radiology report(s): No results found.  ____________________________________________   PROCEDURES  Procedure(s) performed (including Critical Care):  Procedures   ____________________________________________   INITIAL IMPRESSION / ASSESSMENT AND PLAN / ED COURSE  As part of my medical decision making, I reviewed the following data within the [place] notes reviewed and incorporated, Old chart reviewed and Notes from prior ED visits     Ann Gray was evaluated in Emergency Department on 05/29/2019 for the symptoms described in the history of present illness. She was evaluated in the context of the global COVID-19 pandemic, which necessitated consideration that the patient might be at risk for infection with the SARS-CoV-2 virus that causes COVID-19. Institutional protocols and algorithms that pertain to the evaluation of patients at risk for COVID-19 are in a state of rapid change based on information released by regulatory bodies including the CDC and federal and state organizations. These policies and algorithms were followed during the patient's care in the ED.   28 year old female who presents with acute on chronic thoracic back pain.  Will administer IM Toradol, lidocaine patch.  Prescribed Flexeril to use as needed.  Strict return precautions given.  Patient verbalizes understanding and agrees  with plan of care.      ____________________________________________   FINAL CLINICAL IMPRESSION(S) / ED DIAGNOSES  Final diagnoses:  Thoracic myofascial strain, initial encounter  Chronic bilateral thoracic back pain     ED Discharge Orders         Ordered    lidocaine (LIDODERM) 5 %  Every 24 hours     05/29/19 0310    cyclobenzaprine (FLEXERIL) 5 MG tablet     05/29/19 0310           Note:  This document was prepared using Dragon voice recognition software and may include unintentional dictation errors.   Paulette Blanch, MD 05/29/19 [phone]

## 2019-05-29 NOTE — Discharge Instructions (Signed)
1.  You may take Tylenol and/or ibuprofen as needed for pain. 2.  Apply lidocaine patch as instructed for pain. 3.  You may take Flexeril as needed for muscle spasms. 4.  Return to the ER for worsening symptoms, persistent vomiting, numbness/tingling, extremity weakness or other concerns.

## 2019-07-04 IMAGING — CR DG CHEST 2V
1 series · 2 of 2 positions shown · non-contrast
Comparison: None.

CLINICAL DATA: Cough for the past week.  Smoker.

EXAM:
CHEST - 2 VIEW

[Series 1: dg chest 2 view · 0.14mm/px · 2 of 2 slices shown]
[im 1/2]
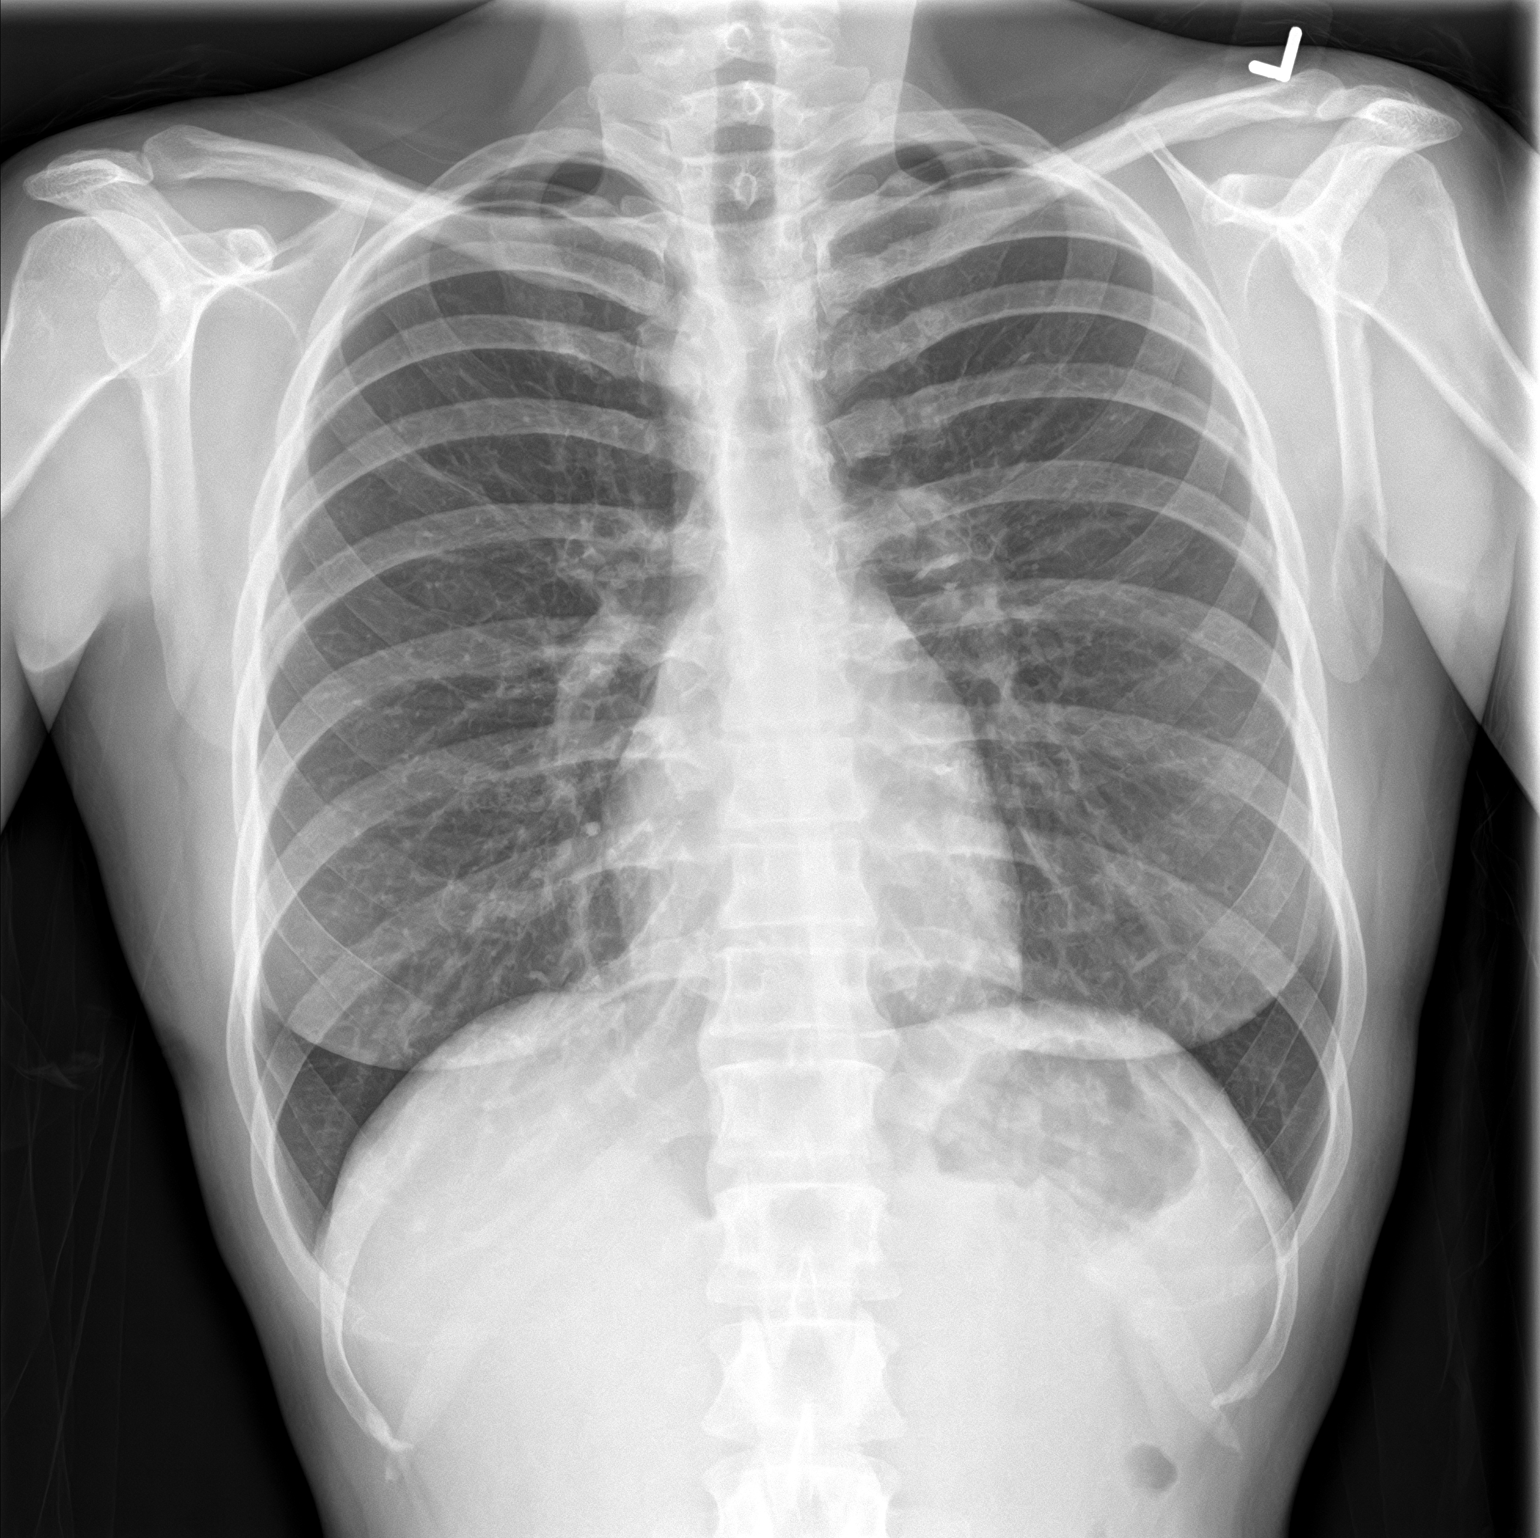
[im 2/2]
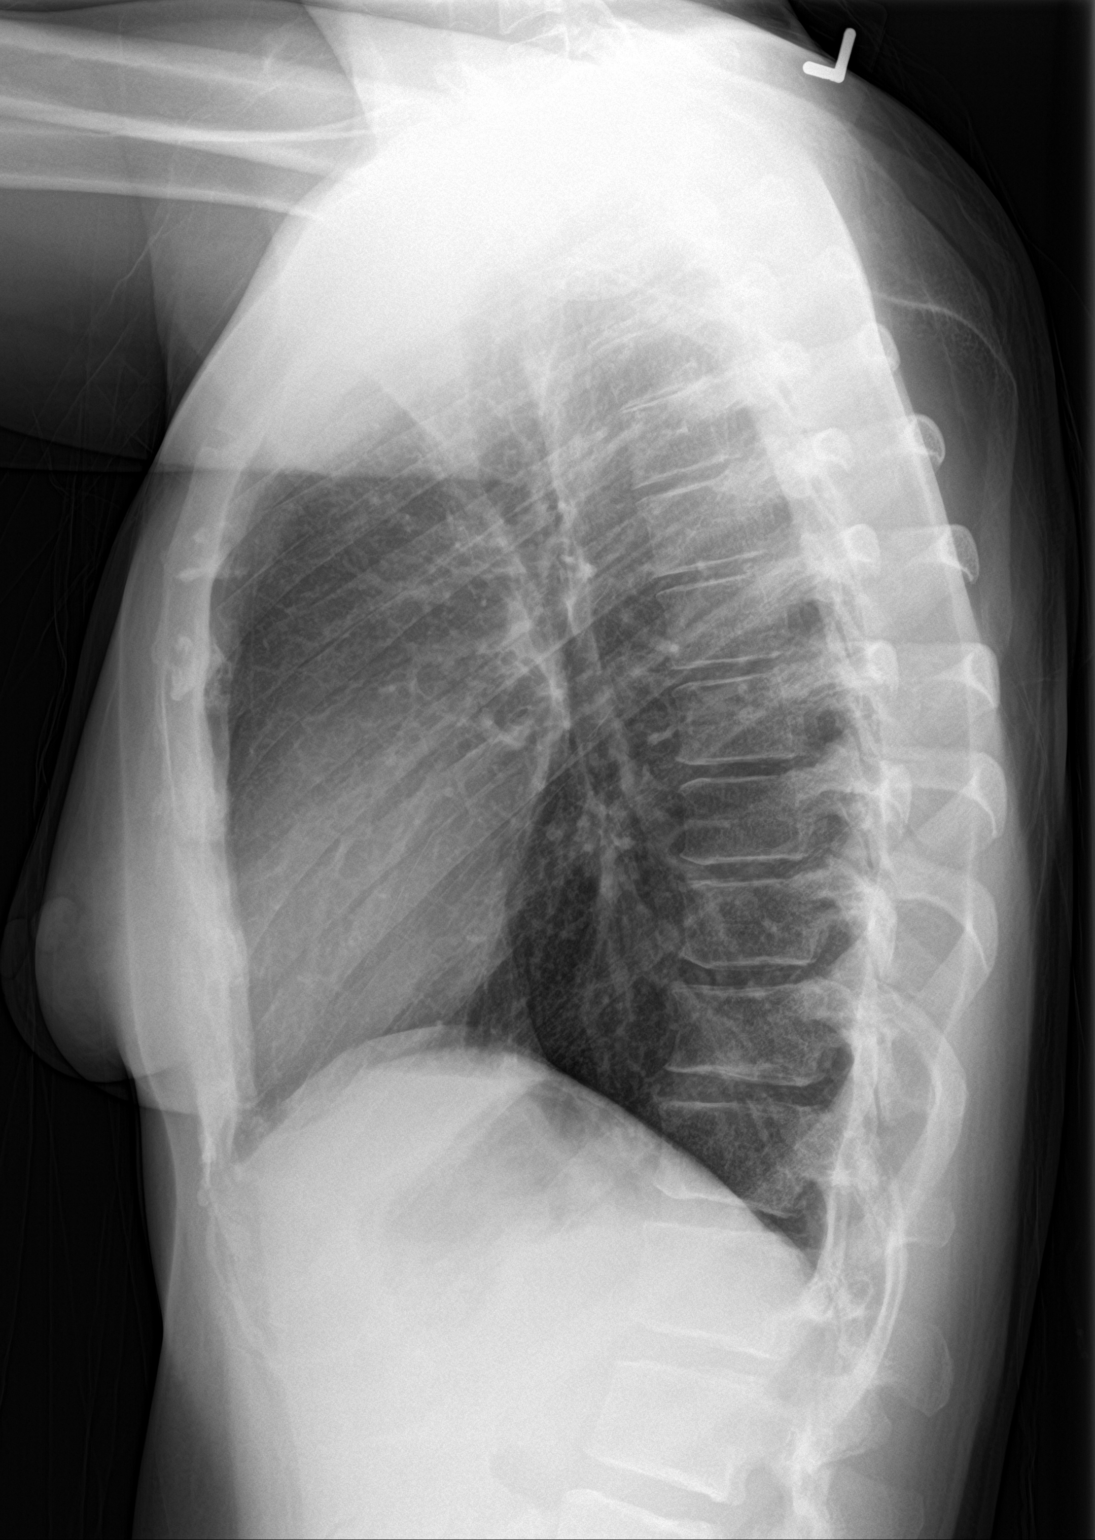

[2 of 2 positions shown; findings below may reference images not displayed]

FINDINGS: Normal sized heart. Clear lungs. The lungs are mildly hyperexpanded
with mild peribronchial thickening. Minimal thoracic spine
degenerative changes.
IMPRESSION: Mild changes of COPD and chronic bronchitis.

## 2019-10-06 ENCOUNTER — Other Ambulatory Visit: Payer: Self-pay

## 2019-10-06 DIAGNOSIS — Z20822 Contact with and (suspected) exposure to covid-19: Secondary | ICD-10-CM

## 2019-10-07 LAB — NOVEL CORONAVIRUS, NAA: SARS-CoV-2, NAA: NOT DETECTED

## 2020-03-27 ENCOUNTER — Other Ambulatory Visit: Payer: Self-pay

## 2020-03-27 ENCOUNTER — Emergency Department: Payer: Self-pay

## 2020-03-27 ENCOUNTER — Encounter: Payer: Self-pay | Admitting: Emergency Medicine

## 2020-03-27 ENCOUNTER — Emergency Department
Admission: EM | Admit: 2020-03-27 | Discharge: 2020-03-27 | Disposition: A | Discharge status: Home / Self Care | Payer: Self-pay | Attending: Emergency Medicine | Admitting: Emergency Medicine

## 2020-03-27 DIAGNOSIS — Z79899 Other long term (current) drug therapy: Secondary | ICD-10-CM | POA: Insufficient documentation

## 2020-03-27 DIAGNOSIS — M62830 Muscle spasm of back: Secondary | ICD-10-CM | POA: Insufficient documentation

## 2020-03-27 DIAGNOSIS — J45909 Unspecified asthma, uncomplicated: Secondary | ICD-10-CM | POA: Insufficient documentation

## 2020-03-27 DIAGNOSIS — J449 Chronic obstructive pulmonary disease, unspecified: Secondary | ICD-10-CM | POA: Insufficient documentation

## 2020-03-27 DIAGNOSIS — G8929 Other chronic pain: Secondary | ICD-10-CM

## 2020-03-27 DIAGNOSIS — F17200 Nicotine dependence, unspecified, uncomplicated: Secondary | ICD-10-CM | POA: Insufficient documentation

## 2020-03-27 MED ORDER — KETOROLAC TROMETHAMINE 30 MG/ML IJ SOLN
30.0000 mg | Freq: Once | INTRAMUSCULAR | Status: AC
  Administered 2020-03-27: 19:00:00 30 mg via INTRAMUSCULAR
  Filled 2020-03-27: qty 1

## 2020-03-27 MED ORDER — METHOCARBAMOL 500 MG PO TABS: 500.0000 mg | ORAL_TABLET | Freq: Three times a day (TID) | ORAL | 1 refills | Status: AC | PRN

## 2020-03-27 MED ORDER — MELOXICAM 15 MG PO TABS: 15.0000 mg | ORAL_TABLET | Freq: Every day | ORAL | 2 refills | Status: AC

## 2020-03-27 MED ORDER — PENTAFLUOROPROP-TETRAFLUOROETH EX AERO: 1.0000 "application " | INHALATION_SPRAY | CUTANEOUS | Status: DC | PRN

## 2020-03-27 MED ORDER — KETOROLAC TROMETHAMINE 10 MG PO TABS: 10.0000 mg | ORAL_TABLET | Freq: Three times a day (TID) | ORAL | 0 refills | Status: DC

## 2020-03-27 NOTE — ED Provider Notes (Signed)
[facility] Emergency Department Provider Note ____________________________________________  Time seen: 1732  I have reviewed the triage vital signs and the nursing notes.  HISTORY  Chief Complaint  Back Pain  HPI Ann Gray is a 29 y.o. female presents to the ED for evaluation of acute on chronic mid-back pain. She gives a history of persistent back pain since last year following a mechanical injury.  She reports the onset of her symptoms after she unfortunately found her wife after she committed suicide by hanging.  She and her friend were the wounds that had to cut her left down.  She admits to lowering her wife down on her shoulders, and in doing so was where she initially injured her mid back.  She describes the sensation of a "knife stabbing her in the back" on the left greater than right sides. She notes the pain is worsened by upright position, or sitting up straight. She denies chest pain, SOB, diaphoresis, nausea, vomiting, or dizziness.  She also denies any cough, congestion, or hemoptysis.  Patient describes muscle spasms to the right side of the spine just inferior to the shoulder blade.  Past Medical History:  Diagnosis Date  . Asthma   . COPD (chronic obstructive pulmonary disease) (HCC)   . Murmur, cardiac     There are no problems to display for this patient.   Past Surgical History:  Procedure Laterality Date  . TONSILLECTOMY      Prior to Admission medications   Medication Sig Start Date End Date Taking? Authorizing Provider  albuterol (PROVENTIL HFA;VENTOLIN HFA) 108 (90 Base) MCG/ACT inhaler Inhale 2 puffs into the lungs every 6 (six) hours as needed for wheezing or shortness of breath. 04/06/18   Enid Derry, PA-C  ketorolac (TORADOL) 10 MG tablet Take 1 tablet (10 mg total) by mouth every 8 (eight) hours. 03/27/20   Meredyth Hornung, Charlesetta Ivory, PA-C  meloxicam (MOBIC) 15 MG tablet Take 1 tablet (15 mg total) by mouth daily. 03/27/20  06/25/20  Tanysha Quant, Charlesetta Ivory, PA-C  methocarbamol (ROBAXIN) 500 MG tablet Take 1 tablet (500 mg total) by mouth every 8 (eight) hours as needed for up to 20 days for muscle spasms. 03/27/20 04/16/20  Jernee Murtaugh, Charlesetta Ivory, PA-C  montelukast (SINGULAIR) 10 MG tablet Take 1 tablet (10 mg total) by mouth at bedtime. 04/16/18   Shane Crutch, MD    Allergies Sulfa antibiotics  History reviewed. No pertinent family history.  Social History Social History   Tobacco Use  . Smoking status: Current Every Day Smoker    Packs/day: 0.25  . Smokeless tobacco: Never Used  Substance Use Topics  . Alcohol use: Not Currently    Comment: occ  . Drug use: Not Currently    Review of Systems  Constitutional: Negative for fever. Cardiovascular: Negative for chest pain. Respiratory: Negative for shortness of breath. Gastrointestinal: Negative for abdominal pain, vomiting and diarrhea. Genitourinary: Negative for dysuria. Musculoskeletal: Positive for back pain. Skin: Negative for rash. Neurological: Negative for headaches, focal weakness or numbness. ____________________________________________  PHYSICAL EXAM:  VITAL SIGNS: ED Triage Vitals  Enc Vitals Group     BP 03/27/20 1553 125/71     Pulse Rate 03/27/20 1553 98     Resp 03/27/20 1553 18     Temp 03/27/20 1553 97.9 F (36.6 C)     Temp Source 03/27/20 1553 Oral     SpO2 03/27/20 1553 99 %     Weight 03/27/20 1600 215 lb (97.5  kg)     Height 03/27/20 1600 5\' 5"  (1.651 m)     Head Circumference --      Peak Flow --      Pain Score 03/27/20 1559 8     Pain Loc --      Pain Edu? --      Excl. in [place]? --     Constitutional: Alert and oriented. Well appearing and in no distress. Head: Normocephalic and atraumatic. Eyes: Conjunctivae are normal. Normal extraocular movements Neck: Supple.  Normal range of motion without crepitus. Cardiovascular: Normal rate, regular rhythm. Normal distal pulses. Respiratory: Normal  respiratory effort. No wheezes/rales/rhonchi. Gastrointestinal: Soft and nontender. No distention. Musculoskeletal: Normal spinal alignment without midline tenderness, spasm, deformity, or step-off.  Patient has been to palpation to the right scapulothoracic region between the spine and the medial border of the shoulder blade.  Palpable muscle spasm in the same region.  Pain is reproducible with palpation and does not refer or radiate.  Nontender with normal range of motion in all extremities.  Neurologic: Cranial nerves II through XII grossly intact.  Normal gait without ataxia. Normal speech and language. No gross focal neurologic deficits are appreciated. Skin:  Skin is warm, dry and intact. No rash noted. Psychiatric: Mood and affect are normal. Patient exhibits appropriate insight and judgment. ____________________________________________   RADIOLOGY  DG Thoracic Spine IMPRESSION: Mild anterior wedging of lower thoracic/upper lumbar vertebral bodies with less than 10% loss of anterior height is age indeterminate but may be nonacute. Recommend clinical correlation. ____________________________________________  PROCEDURES  Toradol 330 mg IM  Procedures ____________________________________________  INITIAL IMPRESSION / ASSESSMENT AND PLAN / ED COURSE  Patient with ED evaluation of acute on chronic mid back muscle pain and spasm.  Patient reports symptoms have been persistent since injury almost a year ago.  Exam is overall benign reassuring at this time.  Patient does have palpable reproducible pain to the right scapulothoracic region.  X-ray did not show any acute findings but she does have some mild anterior vertebral body height loss.  Patient is reassured overall by her exam and x-ray findings.  She will take over-the-counter anti-inflammatories muscle relaxants as prescribed.  She is also encouraged to consider migration by chiropractor.  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  Ann Gray was evaluated in Emergency Department on 03/28/2020 for the symptoms described in the history of present illness. She was evaluated in the context of the global COVID-19 pandemic, which necessitated consideration that the patient might be at risk for infection with the SARS-CoV-2 virus that causes COVID-19. Institutional protocols and algorithms that pertain to the evaluation of patients at risk for COVID-19 are in a state of rapid change based on information released by regulatory bodies including the CDC and federal and state organizations. These policies and algorithms were followed during the patient's care in the ED. ____________________________________________  FINAL CLINICAL IMPRESSION(S) / ED DIAGNOSES  Final diagnoses:  Muscle spasm of back  Chronic right-sided thoracic back pain      Joleene Burnham, Dannielle Karvonen, PA-C 03/28/20 0029    Vanessa Niarada, MD 03/29/20 1513

## 2020-03-27 NOTE — Discharge Instructions (Addendum)
Your exam and XR reveal old, mild compressions of the lower midback. Take the prescription meds as directed. Follow-up with your new primary provider. You may also consider a visit with Ortho. Consider evaluation and management with a Chiropractor for ongoing symptoms. Return to the ED as needed.

## 2020-03-27 NOTE — ED Triage Notes (Signed)
Pt to ER with c/o sharp back pain "for a while".  Pt states pain with certain movements.  Pt states old back injury.

## 2020-04-18 IMAGING — CT CT ANGIO CHEST
2 of 6 series · 17 of 46 positions shown · IV contrast (APPLIED)
Comparison: None.

CLINICAL DATA: Shortness of breath and chest pain since 2am. Hx of
asthma and COPD. Chronic, intermittent cough.

EXAM:
CT ANGIOGRAPHY CHEST WITH CONTRAST
TECHNIQUE: Multidetector CT imaging of the chest was performed using the
standard protocol during bolus administration of intravenous
contrast. Multiplanar CT image reconstructions and MIPs were
obtained to evaluate the vascular anatomy.
CONTRAST:  75mL OMNIPAQUE IOHEXOL 350 MG/ML SOLN

[Series 6: thins · axial · 0.58mm/px · z∈[-737,-519]mm · 14 of 240 slices shown]
[im 11/240  lung]
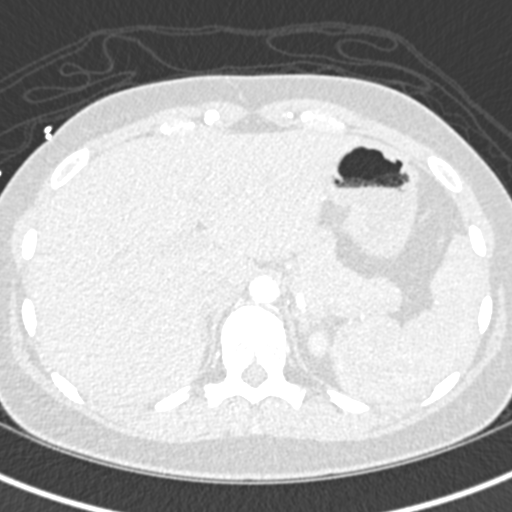
[im 32/240  soft-tissue]
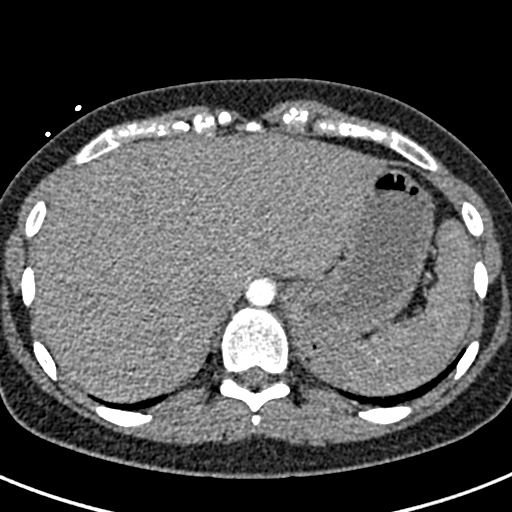
[im 42/240  lung]
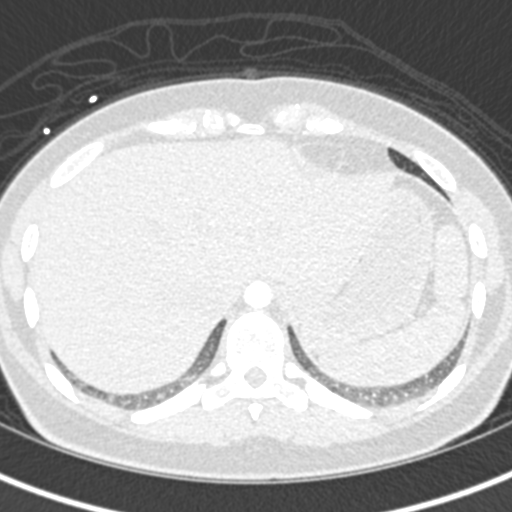
[im 63/240  soft-tissue]
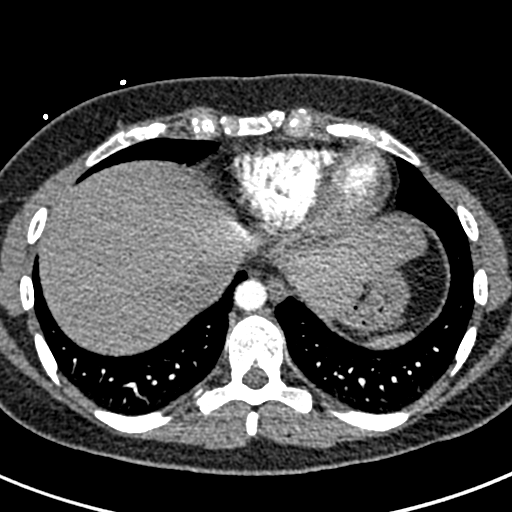
[im 84/240  lung]
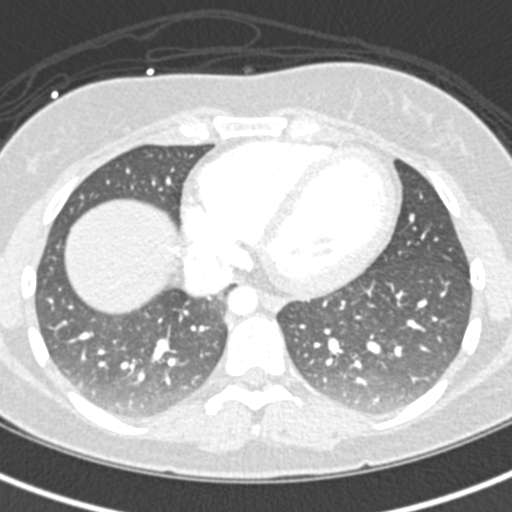
[im 94/240  soft-tissue]
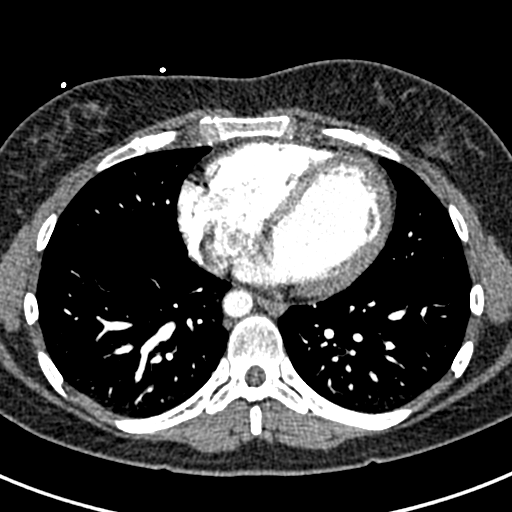
[im 115/240  lung]
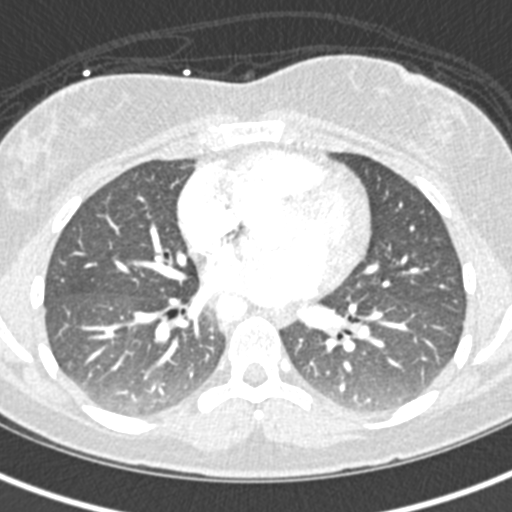
[im 125/240  soft-tissue]
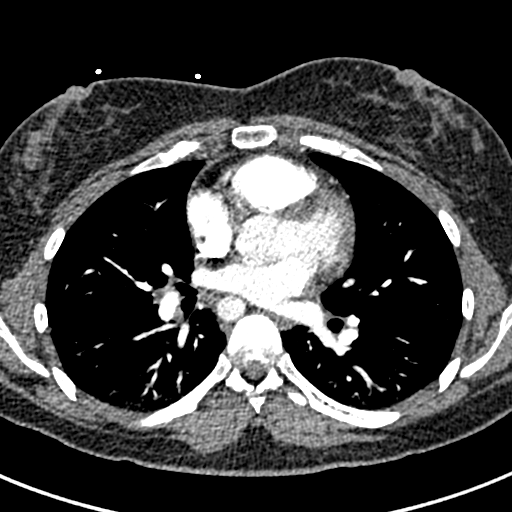
[im 146/240  lung]
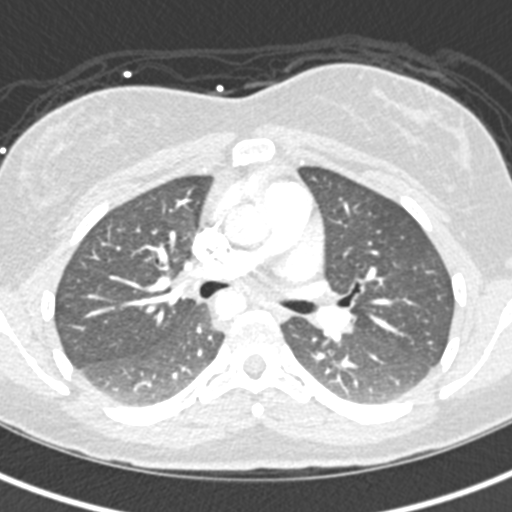
[im 156/240  soft-tissue]
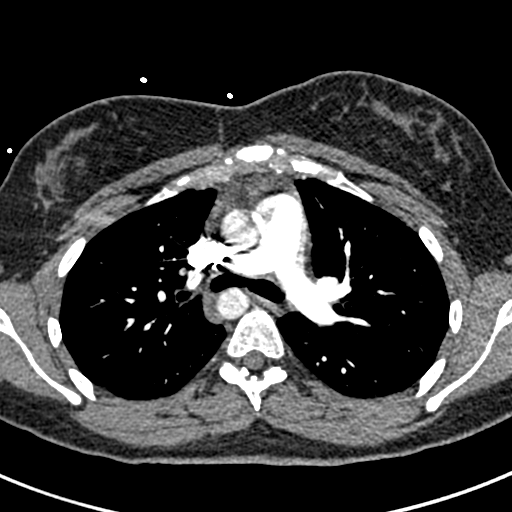
[im 177/240  lung]
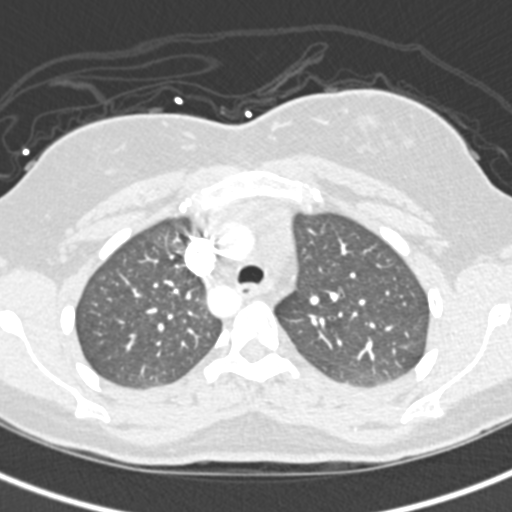
[im 198/240  soft-tissue]
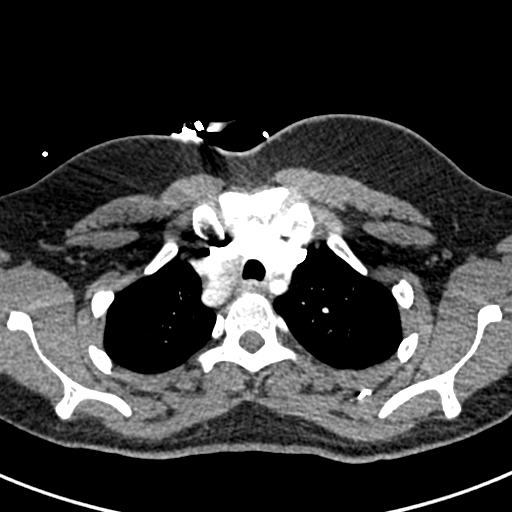
[im 208/240  lung]
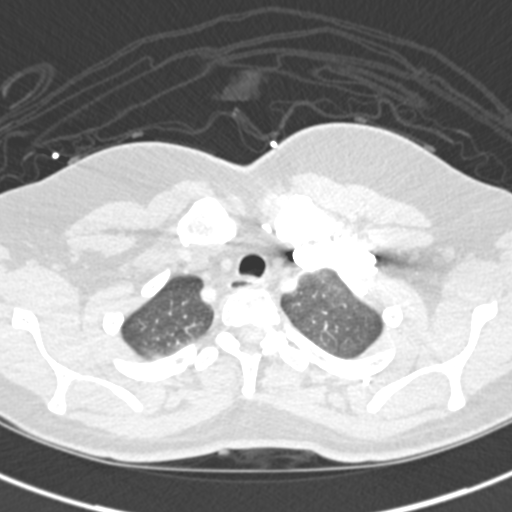
[im 229/240  soft-tissue]
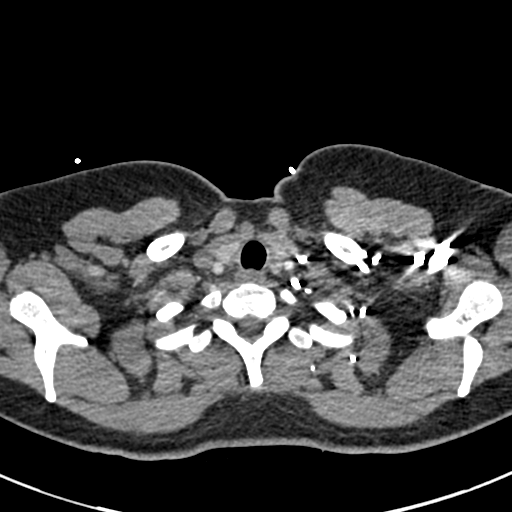

[Series 8: coronal mpr · coronal · 0.49mm/px · 3 of 79 slices shown]
[im 20/79  soft-tissue]
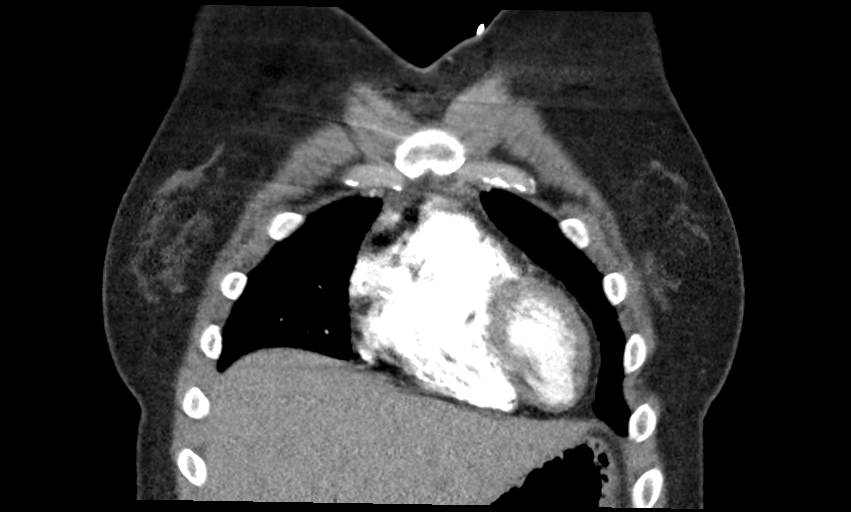
[im 40/79  soft-tissue]
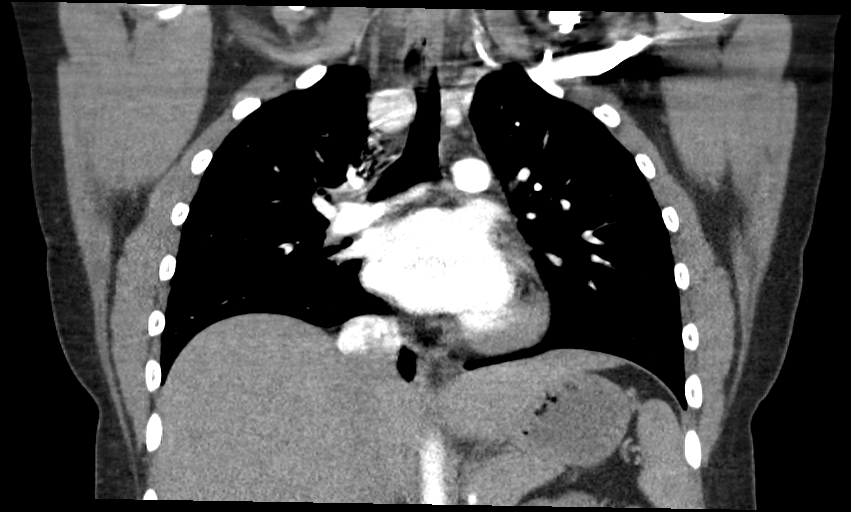
[im 59/79  soft-tissue]
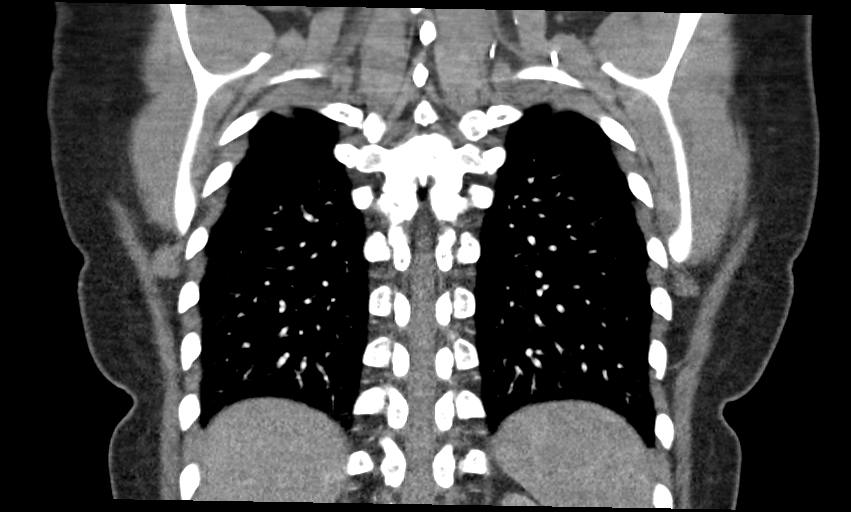

[17 of 46 positions shown; findings below may reference images not displayed]

FINDINGS: Cardiovascular: Heart size normal. No pericardial effusion.
Satisfactory opacification of pulmonary arteries noted, and there is
no evidence of pulmonary emboli. Good contrast opacification of the
thoracic aorta. Right-sided arch with mirror-image brachiocephalic
arterial origin anatomy, an anatomic variant associated with
congenital heart disease. Diverticulum from the proximal descending
segment of uncertain significance. No dissection, aneurysm, or
stenosis.

Mediastinum/Nodes: No hilar or mediastinal adenopathy.

Lungs/Pleura: No pleural effusion. No pneumothorax. Lungs are clear.

Upper Abdomen: No acute abnormality.

Musculoskeletal: No chest wall abnormality. No acute or significant
osseous findings.

Review of the MIP images confirms the above findings.
IMPRESSION: 1. Negative for acute PE or thoracic aortic dissection.
2. Right-sided arch with mirror-image brachiocephalic arterial
origin anatomy, an anatomic variant associated with congenital heart
disease.

## 2021-01-17 ENCOUNTER — Other Ambulatory Visit: Payer: Self-pay

## 2021-01-17 ENCOUNTER — Emergency Department
Admission: EM | Admit: 2021-01-17 | Discharge: 2021-01-17 | Disposition: A | Discharge status: Home / Self Care | Payer: Self-pay | Attending: Emergency Medicine | Admitting: Emergency Medicine

## 2021-01-17 DIAGNOSIS — J449 Chronic obstructive pulmonary disease, unspecified: Secondary | ICD-10-CM | POA: Insufficient documentation

## 2021-01-17 DIAGNOSIS — R109 Unspecified abdominal pain: Secondary | ICD-10-CM

## 2021-01-17 DIAGNOSIS — J45909 Unspecified asthma, uncomplicated: Secondary | ICD-10-CM | POA: Insufficient documentation

## 2021-01-17 DIAGNOSIS — F17200 Nicotine dependence, unspecified, uncomplicated: Secondary | ICD-10-CM | POA: Insufficient documentation

## 2021-01-17 LAB — CBC
HCT: 38.7 % (ref 36.0–46.0)
Hemoglobin: 13 g/dL (ref 12.0–15.0)
MCH: 29 pg (ref 26.0–34.0)
MCHC: 33.6 g/dL (ref 30.0–36.0)
MCV: 86.4 fL (ref 80.0–100.0)
Platelets: 283 10*3/uL (ref 150–400)
RBC: 4.48 MIL/uL (ref 3.87–5.11)
RDW: 15.2 % (ref 11.5–15.5)
WBC: 10.2 10*3/uL (ref 4.0–10.5)
nRBC: 0 % (ref 0.0–0.2)

## 2021-01-17 LAB — COMPREHENSIVE METABOLIC PANEL
ALT: 14 U/L (ref 0–44)
AST: 14 U/L — ABNORMAL LOW (ref 15–41)
Albumin: 4.3 g/dL (ref 3.5–5.0)
Alkaline Phosphatase: 70 U/L (ref 38–126)
Anion gap: 9 (ref 5–15)
BUN: 10 mg/dL (ref 6–20)
CO2: 26 mmol/L (ref 22–32)
Calcium: 9.5 mg/dL (ref 8.9–10.3)
Chloride: 105 mmol/L (ref 98–111)
Creatinine, Ser: 0.71 mg/dL (ref 0.44–1.00)
GFR, Estimated: >60 mL/min (ref >60)
Glucose, Bld: 117 mg/dL — ABNORMAL HIGH (ref 70–99)
Potassium: 3.8 mmol/L (ref 3.5–5.1)
Sodium: 140 mmol/L (ref 135–145)
Total Bilirubin: 0.8 mg/dL (ref 0.3–1.2)
Total Protein: 7.2 g/dL (ref 6.5–8.1)

## 2021-01-17 LAB — URINALYSIS, COMPLETE (UACMP) WITH MICROSCOPIC
Bilirubin Urine: NEGATIVE
Glucose, UA: NEGATIVE mg/dL
Hgb urine dipstick: NEGATIVE
Ketones, ur: 80 mg/dL — AB
Nitrite: NEGATIVE
Protein, ur: 30 mg/dL — AB
Specific Gravity, Urine: 1.033 — ABNORMAL HIGH (ref 1.005–1.030)
pH: 6 (ref 5.0–8.0)

## 2021-01-17 LAB — LIPASE, BLOOD: Lipase: 52 U/L — ABNORMAL HIGH (ref 11–51)

## 2021-01-17 LAB — POC URINE PREG, ED: Preg Test, Ur: NEGATIVE

## 2021-01-17 MED ORDER — ONDANSETRON 4 MG PO TBDP
4.0000 mg | ORAL_TABLET | Freq: Once | ORAL | Status: AC
  Administered 2021-01-17: 4 mg via ORAL
  Filled 2021-01-17: qty 1

## 2021-01-17 MED ORDER — ONDANSETRON 4 MG PO TBDP: 4.0000 mg | ORAL_TABLET | Freq: Three times a day (TID) | ORAL | 0 refills | Status: DC | PRN

## 2021-01-17 MED ORDER — DICYCLOMINE HCL 10 MG PO CAPS: 10.0000 mg | ORAL_CAPSULE | Freq: Four times a day (QID) | ORAL | 0 refills | Status: DC

## 2021-01-17 MED ORDER — LIDOCAINE VISCOUS HCL 2 % MT SOLN
15.0000 mL | Freq: Once | OROMUCOSAL | Status: AC
  Administered 2021-01-17: 15 mL via ORAL
  Filled 2021-01-17: qty 15

## 2021-01-17 MED ORDER — ALUM & MAG HYDROXIDE-SIMETH 200-200-20 MG/5ML PO SUSP
30.0000 mL | Freq: Once | ORAL | Status: AC
  Administered 2021-01-17: 30 mL via ORAL
  Filled 2021-01-17: qty 30

## 2021-01-17 NOTE — ED Provider Notes (Signed)
[facility] Emergency Department Provider Note   ____________________________________________    I have reviewed the triage vital signs and the nursing notes.   HISTORY  Chief Complaint Abdominal Pain     HPI Ann Gray is a 30 y.o. female with history as below who presents with complaints of abdominal cramping.  Patient reports since this morning she has been having abdominal cramping.  She reports she has had this before the past several times.  She describes it as a diffuse cramping sensation of her abdomen.  She denies fevers or chills, no vomiting.  Has not take anything for this.  No history of abdominal surgery  Past Medical History:  Diagnosis Date  . Asthma   . COPD (chronic obstructive pulmonary disease) (HCC)   . Murmur, cardiac     There are no problems to display for this patient.   Past Surgical History:  Procedure Laterality Date  . TONSILLECTOMY      Prior to Admission medications   Medication Sig Start Date End Date Taking? Authorizing Provider  dicyclomine (BENTYL) 10 MG capsule Take 1 capsule (10 mg total) by mouth 4 (four) times daily for 14 days. 01/17/21 01/31/21 Yes Jene Every, MD  ondansetron (ZOFRAN ODT) 4 MG disintegrating tablet Take 1 tablet (4 mg total) by mouth every 8 (eight) hours as needed. 01/17/21  Yes Jene Every, MD  albuterol (PROVENTIL HFA;VENTOLIN HFA) 108 (90 Base) MCG/ACT inhaler Inhale 2 puffs into the lungs every 6 (six) hours as needed for wheezing or shortness of breath. 04/06/18   Enid Derry, PA-C  ketorolac (TORADOL) 10 MG tablet Take 1 tablet (10 mg total) by mouth every 8 (eight) hours. 03/27/20   Menshew, Charlesetta Ivory, PA-C  montelukast (SINGULAIR) 10 MG tablet Take 1 tablet (10 mg total) by mouth at bedtime. 04/16/18   Shane Crutch, MD     Allergies Sulfa antibiotics  No family history on file.  Social History Social History   Tobacco Use  . Smoking status:  Current Every Day Smoker    Packs/day: 0.25  . Smokeless tobacco: Never Used  Substance Use Topics  . Alcohol use: Yes    Comment: occ  . Drug use: Not Currently    Review of Systems  Constitutional: No fever/chills Eyes: No visual changes.  ENT: No sore throat. Cardiovascular: Denies chest pain. Respiratory: Denies shortness of breath. Gastrointestinal: As above Genitourinary: No dysuria. Musculoskeletal: Negative for back pain. Skin: Negative for rash. Neurological: Negative for headaches or weakness   ____________________________________________   PHYSICAL EXAM:  VITAL SIGNS: ED Triage Vitals  Enc Vitals Group     BP 01/17/21 0718 117/70     Pulse Rate 01/17/21 0718 70     Resp 01/17/21 0718 17     Temp 01/17/21 0719 97.6 F (36.4 C)     Temp Source 01/17/21 0718 Oral     SpO2 01/17/21 0718 100 %     Weight 01/17/21 0719 83.9 kg (185 lb)     Height 01/17/21 0719 1.651 m (5\' 5" )     Head Circumference --      Peak Flow --      Pain Score 01/17/21 0718 2     Pain Loc --      Pain Edu? --      Excl. in [place]? --     Constitutional: Alert and oriented. No acute distress. Pleasant and interactive  Nose: No congestion/rhinnorhea.  Cardiovascular: Normal rate, regular rhythm. Grossly  normal heart sounds.  Good peripheral circulation. Respiratory: Normal respiratory effort.  No retractions. Lungs CTAB. Gastrointestinal: Soft and nontender. No distention.  No CVA tenderness.  Reassuring exam  Musculoskeletal: No lower extremity tenderness nor edema.  Warm and well perfused Neurologic:  Normal speech and language. No gross focal neurologic deficits are appreciated.  Skin:  Skin is warm, dry and intact. No rash noted. Psychiatric: Mood and affect are normal. Speech and behavior are normal.  ____________________________________________   LABS (all labs ordered are listed, but only abnormal results are displayed)  Labs Reviewed  LIPASE, BLOOD - Abnormal; Notable  for the following components:      Result Value   Lipase 52 (*)    All other components within normal limits  COMPREHENSIVE METABOLIC PANEL - Abnormal; Notable for the following components:   Glucose, Bld 117 (*)    AST 14 (*)    All other components within normal limits  URINALYSIS, COMPLETE (UACMP) WITH MICROSCOPIC - Abnormal; Notable for the following components:   Color, Urine YELLOW (*)    APPearance CLOUDY (*)    Specific Gravity, Urine 1.033 (*)    Ketones, ur 80 (*)    Protein, ur 30 (*)    Leukocytes,Ua SMALL (*)    Bacteria, UA RARE (*)    All other components within normal limits  CBC  POC URINE PREG, ED   ____________________________________________  EKG   ____________________________________________  RADIOLOGY   ____________________________________________   PROCEDURES  Procedure(s) performed: No  Procedures   Critical Care performed: No ____________________________________________   INITIAL IMPRESSION / ASSESSMENT AND PLAN / ED COURSE  Pertinent labs & imaging results that were available during my care of the patient were reviewed by me and considered in my medical decision making (see chart for details).  Patient well-appearing in no acute distress, abdominal exam is reassuring.  Lab work is unremarkable.  CBC CMP normal.  LFTs normal.  Very minimally elevated lipase, no tenderness in the epigastrium.  She describes a history of similar cramping, primarily in epigastrium currently.  Suspect gastritis  Treated with GI cocktail with significant improvement.  We will start Bentyl, Zofran outpatient follow-up recommended, return precautions discussed    ____________________________________________   FINAL CLINICAL IMPRESSION(S) / ED DIAGNOSES  Final diagnoses:  Abdominal cramping        Note:  This document was prepared using Dragon voice recognition software and may include unintentional dictation errors.   Jene Every,  MD 01/17/21 [phone]

## 2021-01-17 NOTE — ED Triage Notes (Signed)
Pt c/o generalized abd cramping for the past 2 days with N/V.Marland Kitchen

## 2021-12-07 DIAGNOSIS — F4310 Post-traumatic stress disorder, unspecified: Secondary | ICD-10-CM | POA: Diagnosis not present

## 2021-12-08 DIAGNOSIS — F4310 Post-traumatic stress disorder, unspecified: Secondary | ICD-10-CM | POA: Diagnosis not present

## 2022-01-03 DIAGNOSIS — F314 Bipolar disorder, current episode depressed, severe, without psychotic features: Secondary | ICD-10-CM | POA: Diagnosis not present

## 2022-01-18 DIAGNOSIS — F319 Bipolar disorder, unspecified: Secondary | ICD-10-CM | POA: Insufficient documentation

## 2022-02-14 DIAGNOSIS — F314 Bipolar disorder, current episode depressed, severe, without psychotic features: Secondary | ICD-10-CM | POA: Diagnosis not present

## 2022-02-16 DIAGNOSIS — F439 Reaction to severe stress, unspecified: Secondary | ICD-10-CM | POA: Diagnosis not present

## 2022-02-21 DIAGNOSIS — F439 Reaction to severe stress, unspecified: Secondary | ICD-10-CM | POA: Diagnosis not present

## 2022-03-01 DIAGNOSIS — F439 Reaction to severe stress, unspecified: Secondary | ICD-10-CM | POA: Diagnosis not present

## 2022-03-07 DIAGNOSIS — F314 Bipolar disorder, current episode depressed, severe, without psychotic features: Secondary | ICD-10-CM | POA: Diagnosis not present

## 2022-03-08 DIAGNOSIS — F439 Reaction to severe stress, unspecified: Secondary | ICD-10-CM | POA: Diagnosis not present

## 2022-03-15 DIAGNOSIS — F439 Reaction to severe stress, unspecified: Secondary | ICD-10-CM | POA: Diagnosis not present

## 2022-03-22 DIAGNOSIS — F439 Reaction to severe stress, unspecified: Secondary | ICD-10-CM | POA: Diagnosis not present

## 2022-03-29 DIAGNOSIS — F439 Reaction to severe stress, unspecified: Secondary | ICD-10-CM | POA: Diagnosis not present

## 2022-04-05 DIAGNOSIS — F439 Reaction to severe stress, unspecified: Secondary | ICD-10-CM | POA: Diagnosis not present

## 2022-04-12 DIAGNOSIS — F439 Reaction to severe stress, unspecified: Secondary | ICD-10-CM | POA: Diagnosis not present

## 2022-04-19 DIAGNOSIS — F439 Reaction to severe stress, unspecified: Secondary | ICD-10-CM | POA: Diagnosis not present

## 2022-04-26 DIAGNOSIS — F439 Reaction to severe stress, unspecified: Secondary | ICD-10-CM | POA: Diagnosis not present

## 2022-05-03 DIAGNOSIS — F439 Reaction to severe stress, unspecified: Secondary | ICD-10-CM | POA: Diagnosis not present

## 2022-05-17 DIAGNOSIS — F439 Reaction to severe stress, unspecified: Secondary | ICD-10-CM | POA: Diagnosis not present

## 2022-05-18 DIAGNOSIS — F1290 Cannabis use, unspecified, uncomplicated: Secondary | ICD-10-CM | POA: Diagnosis not present

## 2022-05-18 DIAGNOSIS — F17210 Nicotine dependence, cigarettes, uncomplicated: Secondary | ICD-10-CM | POA: Diagnosis not present

## 2022-05-18 DIAGNOSIS — M7989 Other specified soft tissue disorders: Secondary | ICD-10-CM | POA: Diagnosis not present

## 2022-05-18 DIAGNOSIS — Z882 Allergy status to sulfonamides status: Secondary | ICD-10-CM | POA: Diagnosis not present

## 2022-05-18 DIAGNOSIS — L0291 Cutaneous abscess, unspecified: Secondary | ICD-10-CM | POA: Diagnosis not present

## 2022-05-18 DIAGNOSIS — L02415 Cutaneous abscess of right lower limb: Secondary | ICD-10-CM | POA: Diagnosis not present

## 2022-05-18 DIAGNOSIS — Z6832 Body mass index (BMI) 32.0-32.9, adult: Secondary | ICD-10-CM | POA: Diagnosis not present

## 2022-05-18 DIAGNOSIS — Z7289 Other problems related to lifestyle: Secondary | ICD-10-CM | POA: Diagnosis not present

## 2022-05-20 DIAGNOSIS — L02415 Cutaneous abscess of right lower limb: Secondary | ICD-10-CM | POA: Diagnosis not present

## 2022-05-20 DIAGNOSIS — L03115 Cellulitis of right lower limb: Secondary | ICD-10-CM | POA: Diagnosis not present

## 2022-05-20 DIAGNOSIS — F17210 Nicotine dependence, cigarettes, uncomplicated: Secondary | ICD-10-CM | POA: Diagnosis not present

## 2022-09-08 ENCOUNTER — Encounter: Payer: Self-pay | Admitting: Emergency Medicine

## 2022-09-08 ENCOUNTER — Emergency Department
Admission: EM | Admit: 2022-09-08 | Discharge: 2022-09-08 | Disposition: A | Discharge status: Home / Self Care | Payer: BC Managed Care – PPO | Attending: Emergency Medicine | Admitting: Emergency Medicine

## 2022-09-08 ENCOUNTER — Other Ambulatory Visit: Payer: Self-pay

## 2022-09-08 DIAGNOSIS — U071 COVID-19: Secondary | ICD-10-CM | POA: Insufficient documentation

## 2022-09-08 DIAGNOSIS — M791 Myalgia, unspecified site: Secondary | ICD-10-CM | POA: Diagnosis not present

## 2022-09-08 DIAGNOSIS — B349 Viral infection, unspecified: Secondary | ICD-10-CM | POA: Insufficient documentation

## 2022-09-08 NOTE — ED Provider Notes (Signed)
   [facility] Provider Note    Event Date/Time   First MD Initiated Contact with Patient 09/08/22 0045     (approximate)   History   Generalized Body Aches and Letter for School/Work   HPI  Ann Gray is a 31 y.o. female who presents to the ED for evaluation of Generalized Body Aches and Letter for School/Work   Patient presents to the ED at the direction of her boss for a note because she has COVID.  She shows me on her phone a picture of a home antigen test that is positive for COVID-19.  Apparently her boss needs a note from a physician in order for her to be out of work for a few days and recommended that she come to the ER for this.  She reports a couple days of myalgias and fevers and feeling unwell with malaise.   Physical Exam   Triage Vital Signs: ED Triage Vitals  Enc Vitals Group     BP 09/08/22 0034 138/78     Pulse Rate 09/08/22 0034 89     Resp 09/08/22 0034 18     Temp 09/08/22 0034 98.4 F (36.9 C)     Temp Source 09/08/22 0034 Oral     SpO2 09/08/22 0034 100 %     Weight 09/08/22 0031 180 lb (81.6 kg)     Height 09/08/22 0031 5\' 5"  (1.651 m)     Head Circumference --      Peak Flow --      Pain Score 09/08/22 0031 0     Pain Loc --      Pain Edu? --      Excl. in [place]? --     Most recent vital signs: Vitals:   09/08/22 0034  BP: 138/78  Pulse: 89  Resp: 18  Temp: 98.4 F (36.9 C)  SpO2: 100%    General: Awake, no distress.  CV:  Good peripheral perfusion.  Resp:  Normal effort.  Abd:  No distention.  MSK:  No deformity noted.  Neuro:  No focal deficits appreciated. Other:     ED Results / Procedures / Treatments   Labs (all labs ordered are listed, but only abnormal results are displayed) Labs Reviewed - No data to display  EKG   RADIOLOGY   Official radiology report(s): No results found.  PROCEDURES and INTERVENTIONS:  Procedures  Medications - No data to display   IMPRESSION / MDM /  [place] / ED COURSE  I reviewed the triage vital signs and the nursing notes.  Differential diagnosis includes, but is not limited to, COVID, dehydration, syncope, sepsis  31 year old female presents to the ED for a work note.  Work note provided.  She looks well we discussed management of COVID-19 and viral illnesses at home.  Discussed return precautions.      FINAL CLINICAL IMPRESSION(S) / ED DIAGNOSES   Final diagnoses:  [id number]     Rx / DC Orders   ED Discharge Orders     None        Note:  This document was prepared using Dragon voice recognition software and may include unintentional dictation errors.   Vladimir Crofts, MD 09/08/22 [phone]

## 2022-09-08 NOTE — ED Triage Notes (Signed)
Patient ambulatory to triage with steady gait, without difficulty or distress noted; pt reports +COVID test at home today and needs a work note; has been having fever & bodyaches

## 2022-09-08 NOTE — Discharge Instructions (Signed)
Please take Tylenol and ibuprofen/Advil for your pain.  It is safe to take them together, or to alternate them every few hours.  Take up to 1000mg of Tylenol at a time, up to 4 times per day.  Do not take more than 4000 mg of Tylenol in 24 hours.  For ibuprofen, take 400-600 mg, 3 - 4 times per day.  

## 2023-03-25 ENCOUNTER — Telehealth: Payer: BC Managed Care – PPO | Admitting: Family

## 2023-03-25 DIAGNOSIS — J4520 Mild intermittent asthma, uncomplicated: Secondary | ICD-10-CM

## 2023-03-25 DIAGNOSIS — G2581 Restless legs syndrome: Secondary | ICD-10-CM

## 2023-03-25 DIAGNOSIS — J301 Allergic rhinitis due to pollen: Secondary | ICD-10-CM | POA: Diagnosis not present

## 2023-03-25 MED ORDER — ROPINIROLE HCL 1 MG PO TABS: 1.0000 mg | ORAL_TABLET | Freq: Every day | ORAL | 0 refills | Status: DC

## 2023-03-25 MED ORDER — MONTELUKAST SODIUM 10 MG PO TABS: 10.0000 mg | ORAL_TABLET | Freq: Every day | ORAL | 1 refills | Status: DC

## 2023-03-25 MED ORDER — FLUTICASONE PROPIONATE 50 MCG/ACT NA SUSP: 2.0000 | Freq: Every day | NASAL | 6 refills | Status: DC

## 2023-03-25 MED ORDER — CETIRIZINE HCL 10 MG PO TABS: 10.0000 mg | ORAL_TABLET | Freq: Every day | ORAL | 1 refills | Status: DC

## 2023-03-25 NOTE — Patient Instructions (Signed)
Restless Legs Syndrome Restless legs syndrome is a condition that causes uncomfortable feelings or sensations in the legs, especially while sitting or lying down. The sensations usually cause an overwhelming urge to move the legs. The arms can also sometimes be affected. The condition can range from mild to severe. The symptoms often interfere with a person's ability to sleep. What are the causes? The cause of this condition is not known. What increases the risk? The following factors may make you more likely to develop this condition: Being older than 50. Pregnancy. Being a woman. In general, the condition is more common in women than in men. A family history of the condition. Having iron deficiency. Overuse of caffeine, nicotine, or alcohol. Certain medical conditions, such as kidney disease, Parkinson's disease, or nerve damage. Certain medicines, such as those for high blood pressure, nausea, colds, allergies, depression, and some heart conditions. What are the signs or symptoms? The main symptom of this condition is uncomfortable sensations in the legs, such as: Pulling. Tingling. Prickling. Throbbing. Crawling. Burning. Usually, the sensations: Affect both sides of the body. Are worse when you sit or lie down. Are worse at night. These may make it difficult to fall asleep. Make you have a strong urge to move your legs. Are temporarily relieved by moving your legs or standing. The arms can also be affected, but this is rare. People who have this condition often have tiredness during the day because of their lack of sleep at night. How is this diagnosed? This condition may be diagnosed based on: Your symptoms. Blood tests. In some cases, you may be monitored in a sleep lab by a specialist (a sleep study). This can detect any disruptions in your sleep. How is this treated? This condition is treated by managing the symptoms. This may include: Lifestyle changes, such as  exercising, using relaxation techniques, and avoiding caffeine, alcohol, or tobacco. Iron supplements. Medicines. Parkinson's medications may be tried first. Anti-seizure medications can also be helpful. Follow these instructions at home: General instructions Take over-the-counter and prescription medicines only as told by your health care provider. Use methods to help relieve the uncomfortable sensations, such as: Massaging your legs. Walking or stretching. Taking a cold or hot bath. Keep all follow-up visits. This is important. Lifestyle     Practice good sleep habits. For example, go to bed and get up at the same time every day. Most adults should get 7-9 hours of sleep each night. Exercise regularly. Try to get at least 30 minutes of exercise most days of the week. Practice ways of relaxing, such as yoga or meditation. Avoid caffeine and alcohol. Do not use any products that contain nicotine or tobacco. These products include cigarettes, chewing tobacco, and vaping devices, such as e-cigarettes. If you need help quitting, ask your health care provider. Where to find more information National Institute of Neurological Disorders and Stroke: www.ninds.nih.gov Contact a health care provider if: Your symptoms get worse or they do not improve with treatment. Summary Restless legs syndrome is a condition that causes uncomfortable feelings or sensations in the legs, especially while sitting or lying down. The symptoms often interfere with your ability to sleep. This condition is treated by managing the symptoms. You may need to make lifestyle changes or take medicines. This information is not intended to replace advice given to you by your health care provider. Make sure you discuss any questions you have with your health care provider. Document Revised: 07/17/2021 Document Reviewed: 07/17/2021 Elsevier Patient Education    2023 [employer]  

## 2023-03-25 NOTE — Progress Notes (Signed)
Virtual Visit Consent   Ann Gray, you are scheduled for a virtual visit with a [facility] provider today. Just as with appointments in the office, your consent must be obtained to participate. Your consent will be active for this visit and any virtual visit you may have with one of our providers in the next 365 days. If you have a MyChart account, a copy of this consent can be sent to you electronically.  As this is a virtual visit, video technology does not allow for your provider to perform a traditional examination. This may limit your provider's ability to fully assess your condition. If your provider identifies any concerns that need to be evaluated in person or the need to arrange testing (such as labs, EKG, etc.), we will make arrangements to do so. Although advances in technology are sophisticated, we cannot ensure that it will always work on either your end or our end. If the connection with a video visit is poor, the visit may have to be switched to a telephone visit. With either a video or telephone visit, we are not always able to ensure that we have a secure connection.  By engaging in this virtual visit, you consent to the provision of healthcare and authorize for your insurance to be billed (if applicable) for the services provided during this visit. Depending on your insurance coverage, you may receive a charge related to this service.  I need to obtain your verbal consent now. Are you willing to proceed with your visit today? Jenesis SUMMA LOTSPEICH has provided verbal consent on 03/25/2023 for a virtual visit (video or telephone). Jannifer Rodney, FNP  Date: 03/25/2023 5:55 PM  Virtual Visit via Video Note   I, Jannifer Rodney, connected with  Ann Gray  ([id number], 1991/09/18) on 03/25/23 at  6:00 PM EDT by a video-enabled telemedicine application and verified that I am speaking with the correct person using two identifiers.  Location: Patient: Virtual Visit Location Patient:  Other: visiting family Provider: Virtual Visit Location Provider: Home Office   I discussed the limitations of evaluation and management by telemedicine and the availability of in person appointments. The patient expressed understanding and agreed to proceed.    History of Present Illness: Ann Gray is a 32 y.o. who identifies as a female who was assigned female at birth, and is being seen today for allergies. She usually takes Singulair but the rx has expired. Requesting refill.   She is also requesting medication for restless leg syndrome. Reports laying in bed she constantly moves her legs. Her wife is complaining about her kicking her all the time.   HPI: Asthma She complains of chest tightness, cough, sputum production and wheezing. This is a chronic problem. The current episode started more than 1 year ago. Associated symptoms include trouble swallowing. Her symptoms are aggravated by exercise. She reports minimal improvement on treatment. Her past medical history is significant for asthma.    Problems: There are no problems to display for this patient.   Allergies:  Allergies  Allergen Reactions   Septra [Sulfamethoxazole-Trimethoprim]    Sulfa Antibiotics Rash    Pt "pretty sure of splotches when taking"   Medications:  Current Outpatient Medications:    cetirizine (ZYRTEC ALLERGY) 10 MG tablet, Take 1 tablet (10 mg total) by mouth daily., Disp: 90 tablet, Rfl: 1   fluticasone (FLONASE) 50 MCG/ACT nasal spray, Place 2 sprays into both nostrils daily., Disp: 16 g, Rfl: 6   rOPINIRole (REQUIP) 1  MG tablet, Take 1 tablet (1 mg total) by mouth at bedtime., Disp: 90 tablet, Rfl: 0   dicyclomine (BENTYL) 10 MG capsule, Take 1 capsule (10 mg total) by mouth 4 (four) times daily for 14 days., Disp: 56 capsule, Rfl: 0   ketorolac (TORADOL) 10 MG tablet, Take 1 tablet (10 mg total) by mouth every 8 (eight) hours., Disp: 15 tablet, Rfl: 0   montelukast (SINGULAIR) 10 MG tablet, Take  1 tablet (10 mg total) by mouth at bedtime., Disp: 90 tablet, Rfl: 1   ondansetron (ZOFRAN ODT) 4 MG disintegrating tablet, Take 1 tablet (4 mg total) by mouth every 8 (eight) hours as needed., Disp: 20 tablet, Rfl: 0  Observations/Objective: Patient is well-developed, well-nourished in no acute distress.  Resting comfortably  at home.  Head is normocephalic, atraumatic.  No labored breathing.  Speech is clear and coherent with logical content.  Patient is alert and oriented at baseline.  Nasal congestion  Assessment and Plan: 1. Mild intermittent asthma without complication - montelukast (SINGULAIR) 10 MG tablet; Take 1 tablet (10 mg total) by mouth at bedtime.  Dispense: 90 tablet; Refill: 1  2. Seasonal allergic rhinitis due to pollen - montelukast (SINGULAIR) 10 MG tablet; Take 1 tablet (10 mg total) by mouth at bedtime.  Dispense: 90 tablet; Refill: 1 - cetirizine (ZYRTEC ALLERGY) 10 MG tablet; Take 1 tablet (10 mg total) by mouth daily.  Dispense: 90 tablet; Refill: 1 - fluticasone (FLONASE) 50 MCG/ACT nasal spray; Place 2 sprays into both nostrils daily.  Dispense: 16 g; Refill: 6  3. Restless leg - rOPINIRole (REQUIP) 1 MG tablet; Take 1 tablet (1 mg total) by mouth at bedtime.  Dispense: 90 tablet; Refill: 0  Start singulair and zyrtec and flonase Requip as needed Limit caffeine  Follow up if symptoms worsen or do not improve   Follow Up Instructions: I discussed the assessment and treatment plan with the patient. The patient was provided an opportunity to ask questions and all were answered. The patient agreed with the plan and demonstrated an understanding of the instructions.  A copy of instructions were sent to the patient via MyChart unless otherwise noted below.     The patient was advised to call back or seek an in-person evaluation if the symptoms worsen or if the condition fails to improve as anticipated.  Time:  I spent 9 minutes with the patient via telehealth  technology discussing the above problems/concerns.    Jannifer Rodney, FNP

## 2023-04-05 DIAGNOSIS — R631 Polydipsia: Secondary | ICD-10-CM | POA: Diagnosis not present

## 2023-04-05 DIAGNOSIS — R202 Paresthesia of skin: Secondary | ICD-10-CM | POA: Diagnosis not present

## 2023-04-05 DIAGNOSIS — R42 Dizziness and giddiness: Secondary | ICD-10-CM | POA: Diagnosis not present

## 2023-04-05 DIAGNOSIS — R233 Spontaneous ecchymoses: Secondary | ICD-10-CM | POA: Diagnosis not present

## 2023-04-05 DIAGNOSIS — Z131 Encounter for screening for diabetes mellitus: Secondary | ICD-10-CM | POA: Diagnosis not present

## 2023-05-27 ENCOUNTER — Telehealth: Payer: BC Managed Care – PPO | Admitting: Family

## 2023-05-27 DIAGNOSIS — R109 Unspecified abdominal pain: Secondary | ICD-10-CM | POA: Diagnosis not present

## 2023-05-27 DIAGNOSIS — R140 Abdominal distension (gaseous): Secondary | ICD-10-CM

## 2023-05-27 DIAGNOSIS — R197 Diarrhea, unspecified: Secondary | ICD-10-CM | POA: Diagnosis not present

## 2023-05-27 MED ORDER — DICYCLOMINE HCL 20 MG PO TABS: 20.0000 mg | ORAL_TABLET | Freq: Three times a day (TID) | ORAL | 1 refills | Status: DC

## 2023-05-27 NOTE — Progress Notes (Signed)
Virtual Visit Consent   Ann Gray, you are scheduled for a virtual visit with a [facility] provider today. Just as with appointments in the office, your consent must be obtained to participate. Your consent will be active for this visit and any virtual visit you may have with one of our providers in the next 365 days. If you have a MyChart account, a copy of this consent can be sent to you electronically.  As this is a virtual visit, video technology does not allow for your provider to perform a traditional examination. This may limit your provider's ability to fully assess your condition. If your provider identifies any concerns that need to be evaluated in person or the need to arrange testing (such as labs, EKG, etc.), we will make arrangements to do so. Although advances in technology are sophisticated, we cannot ensure that it will always work on either your end or our end. If the connection with a video visit is poor, the visit may have to be switched to a telephone visit. With either a video or telephone visit, we are not always able to ensure that we have a secure connection.  By engaging in this virtual visit, you consent to the provision of healthcare and authorize for your insurance to be billed (if applicable) for the services provided during this visit. Depending on your insurance coverage, you may receive a charge related to this service.  I need to obtain your verbal consent now. Are you willing to proceed with your visit today? Ann Gray has provided verbal consent on 05/27/2023 for a virtual visit (video or telephone). Jannifer Rodney, FNP  Date: 05/27/2023 12:24 PM  Virtual Visit via Video Note   I, Jannifer Rodney, connected with  Ann Gray  ([id number], [birth year]) on 05/27/23 at 12:15 PM EDT by a video-enabled telemedicine application and verified that I am speaking with the correct person using two identifiers.  Location: Patient: Virtual Visit Location Patient:  Other: car Provider: Virtual Visit Location Provider: Home Office   I discussed the limitations of evaluation and management by telemedicine and the availability of in person appointments. The patient expressed understanding and agreed to proceed.    History of Present Illness: Ann Gray is a 32 y.o. who identifies as a female who was assigned female at birth, and is being seen today for chronic diarrhea 3-4 times a day. She reports bloating and abdominal pain, and cramping. Reports she has had blood in her stools, but not recently. She is worried about having crohn's disease. She has been diagnosed with IBS in the past. Reports fatigue.   She has taken tums and pepto without relief.    HPI: HPI  Problems: There are no problems to display for this patient.   Allergies:  Allergies  Allergen Reactions   Septra [Sulfamethoxazole-Trimethoprim]    Sulfa Antibiotics Rash    Pt "pretty sure of splotches when taking"   Medications:  Current Outpatient Medications:    dicyclomine (BENTYL) 20 MG tablet, Take 1 tablet (20 mg total) by mouth 4 (four) times daily -  before meals and at bedtime., Disp: 120 tablet, Rfl: 1   rOPINIRole (REQUIP) 1 MG tablet, Take 1 tablet (1 mg total) by mouth at bedtime., Disp: 90 tablet, Rfl: 0  Observations/Objective: Patient is well-developed, well-nourished in no acute distress.  Resting comfortably   Head is normocephalic, atraumatic.  No labored breathing.  Speech is clear and coherent with logical content.  Patient is alert  and oriented at baseline.    Assessment and Plan: 1. Abdominal cramping - dicyclomine (BENTYL) 20 MG tablet; Take 1 tablet (20 mg total) by mouth 4 (four) times daily -  before meals and at bedtime.  Dispense: 120 tablet; Refill: 1  2. Abdominal bloating - dicyclomine (BENTYL) 20 MG tablet; Take 1 tablet (20 mg total) by mouth 4 (four) times daily -  before meals and at bedtime.  Dispense: 120 tablet; Refill: 1  3. Diarrhea,  unspecified type - dicyclomine (BENTYL) 20 MG tablet; Take 1 tablet (20 mg total) by mouth 4 (four) times daily -  before meals and at bedtime.  Dispense: 120 tablet; Refill: 1  Start Bentyl QID  Start fiber daily Recommend up with PCP and get GI referral [place] diet Follow up if symptoms worsen or do not improve   Follow Up Instructions: I discussed the assessment and treatment plan with the patient. The patient was provided an opportunity to ask questions and all were answered. The patient agreed with the plan and demonstrated an understanding of the instructions.  A copy of instructions were sent to the patient via MyChart unless otherwise noted below.     The patient was advised to call back or seek an in-person evaluation if the symptoms worsen or if the condition fails to improve as anticipated.  Time:  I spent  11 minutes with the patient via telehealth technology discussing the above problems/concerns.    Jannifer Rodney, FNP

## 2023-06-14 ENCOUNTER — Ambulatory Visit: Payer: BC Managed Care – PPO | Admitting: Physician Assistant

## 2023-06-14 DIAGNOSIS — R1084 Generalized abdominal pain: Secondary | ICD-10-CM | POA: Diagnosis not present

## 2023-06-14 DIAGNOSIS — R1013 Epigastric pain: Secondary | ICD-10-CM | POA: Diagnosis not present

## 2023-06-18 ENCOUNTER — Other Ambulatory Visit: Payer: Self-pay | Admitting: Family

## 2023-06-18 DIAGNOSIS — R109 Unspecified abdominal pain: Secondary | ICD-10-CM

## 2023-06-18 DIAGNOSIS — R197 Diarrhea, unspecified: Secondary | ICD-10-CM

## 2023-06-18 DIAGNOSIS — R140 Abdominal distension (gaseous): Secondary | ICD-10-CM

## 2023-07-19 ENCOUNTER — Ambulatory Visit: Payer: BC Managed Care – PPO | Admitting: Physician Assistant

## 2023-09-21 ENCOUNTER — Other Ambulatory Visit: Payer: Self-pay

## 2023-09-21 ENCOUNTER — Emergency Department
Admission: EM | Admit: 2023-09-21 | Discharge: 2023-09-21 | Disposition: A | Discharge status: Home / Self Care | Payer: BC Managed Care – PPO | Attending: Emergency Medicine | Admitting: Emergency Medicine

## 2023-09-21 DIAGNOSIS — Z20822 Contact with and (suspected) exposure to covid-19: Secondary | ICD-10-CM | POA: Diagnosis not present

## 2023-09-21 DIAGNOSIS — B349 Viral infection, unspecified: Secondary | ICD-10-CM | POA: Diagnosis not present

## 2023-09-21 DIAGNOSIS — Z8616 Personal history of COVID-19: Secondary | ICD-10-CM | POA: Insufficient documentation

## 2023-09-21 DIAGNOSIS — R509 Fever, unspecified: Secondary | ICD-10-CM | POA: Diagnosis present

## 2023-09-21 LAB — RESP PANEL BY RT-PCR (RSV, FLU A&B, COVID)  RVPGX2
Influenza A by PCR: NEGATIVE
Influenza B by PCR: NEGATIVE
Resp Syncytial Virus by PCR: NEGATIVE
SARS Coronavirus 2 by RT PCR: NEGATIVE

## 2023-09-21 MED ORDER — PSEUDOEPH-BROMPHEN-DM 30-2-10 MG/5ML PO SYRP
10.0000 mL | ORAL_SOLUTION | Freq: Four times a day (QID) | ORAL | 0 refills | Status: AC | PRN
Start: 2023-09-21 — End: ?

## 2023-09-21 MED ORDER — BENZONATATE 100 MG PO CAPS: 100.0000 mg | ORAL_CAPSULE | Freq: Three times a day (TID) | ORAL | 0 refills | Status: AC | PRN

## 2023-09-21 NOTE — ED Triage Notes (Signed)
Pt reports she slept a lot yesterday and had fever of 101.5 at work today. Pt took tylenol early today. 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 Pt denies cough, SHOB, sore throat.

## 2023-09-21 NOTE — ED Provider Notes (Signed)
[facility] Provider Note  Patient Contact: 6:44 PM (approximate)   History   Fever   HPI  Ann Gray is a 32 y.o. female who presents to the emergency department complaining of fever, body aches, congestion and fatigue.  Patient has had symptoms for the last 2 to 3 days.  She had a fever at work this morning, took Tylenol and went home.  She states that her boss has made her be seen so that she can get a note.  Patient had COVID roughly 3 weeks ago.  Feels like she has a "cold."     Physical Exam   Triage Vital Signs: ED Triage Vitals  Encounter Vitals Group     BP 09/21/23 1628 (!) 112/94     Systolic BP Percentile --      Diastolic BP Percentile --      Pulse Rate 09/21/23 1626 85     Resp 09/21/23 1626 17     Temp 09/21/23 1626 98.7 F (37.1 C)     Temp Source 09/21/23 1626 Oral     SpO2 09/21/23 1626 100 %     Weight --      Height --      Head Circumference --      Peak Flow --      Pain Score 09/21/23 1627 3     Pain Loc --      Pain Education --      Exclude from Growth Chart --     Most recent vital signs: Vitals:   09/21/23 1626 09/21/23 1628  BP:  (!) 112/94  Pulse: 85   Resp: 17   Temp: 98.7 F (37.1 C)   SpO2: 100%      General: Alert and in no acute distress. ENT:      Ears:       Nose: No congestion/rhinnorhea.      Mouth/Throat: Mucous membranes are moist. Neck: No stridor. No cervical spine tenderness to palpation.  Cardiovascular:  Good peripheral perfusion Respiratory: Normal respiratory effort without tachypnea or retractions. Lungs CTAB. Good air entry to the bases with no decreased or absent breath sounds. Gastrointestinal: Bowel sounds 4 quadrants. Soft and nontender to palpation. No guarding or rigidity. No palpable masses. No distention. No CVA tenderness. Musculoskeletal: Full range of motion to all extremities.  Neurologic:  No gross focal neurologic deficits are appreciated.  Skin:   No rash  noted Other:   ED Results / Procedures / Treatments   Labs (all labs ordered are listed, but only abnormal results are displayed) Labs Reviewed  RESP PANEL BY RT-PCR (RSV, FLU A&B, COVID)  [id number]     EKG     RADIOLOGY    No results found.  PROCEDURES:  Critical Care performed: No  Procedures   MEDICATIONS ORDERED IN ED: Medications - No data to display   IMPRESSION / MDM / ASSESSMENT AND PLAN / ED COURSE  I reviewed the triage vital signs and the nursing notes.                                 Differential diagnosis includes, but is not limited to, viral illness, COVID, flu   Patient's presentation is most consistent with acute presentation with potential threat to life or bodily function.   Patient's diagnosis is consistent with viral illness.  Patient presents to the emergency department with fever, headache, congestion, cough,  fatigue.  Symptoms for the last 2 to 3 days.  Patient is here essentially to receive a note for work.  At this time COVID and flu test were negative.  Patient will have symptom control medications prescribed.  Follow-up primary care as needed..  Patient is given ED precautions to return to the ED for any worsening or new symptoms.     FINAL CLINICAL IMPRESSION(S) / ED DIAGNOSES   Final diagnoses:  Viral illness     Rx / DC Orders   ED Discharge Orders          Ordered    brompheniramine-pseudoephedrine-DM 30-2-10 MG/5ML syrup  4 times daily PRN        09/21/23 1828    benzonatate (TESSALON PERLES) 100 MG capsule  3 times daily PRN        09/21/23 1828             Note:  This document was prepared using Dragon voice recognition software and may include unintentional dictation errors.   Racheal Patches, PA-C 09/21/23 1854    Dionne Bucy, MD 09/21/23 2026

## 2023-11-06 ENCOUNTER — Other Ambulatory Visit: Payer: Self-pay | Admitting: Medical Genetics

## 2023-11-06 DIAGNOSIS — Z006 Encounter for examination for normal comparison and control in clinical research program: Secondary | ICD-10-CM

## 2024-01-03 ENCOUNTER — Emergency Department
Admission: EM | Admit: 2024-01-03 | Discharge: 2024-01-03 | Discharge status: Against Medical Advice | Payer: BC Managed Care – PPO | Attending: Emergency Medicine | Admitting: Emergency Medicine

## 2024-01-03 ENCOUNTER — Other Ambulatory Visit: Payer: Self-pay

## 2024-01-03 DIAGNOSIS — Z5321 Procedure and treatment not carried out due to patient leaving prior to being seen by health care provider: Secondary | ICD-10-CM | POA: Insufficient documentation

## 2024-01-03 DIAGNOSIS — R1030 Lower abdominal pain, unspecified: Secondary | ICD-10-CM | POA: Diagnosis present

## 2024-01-03 NOTE — ED Triage Notes (Signed)
Pt arrives with c/o abscess on her lower ABD that "popped" and is painful. Per pt, the abscess has been draining, but is red. Pt denies fevers.

## 2024-01-05 ENCOUNTER — Other Ambulatory Visit: Payer: Self-pay

## 2024-01-05 ENCOUNTER — Emergency Department
Admission: EM | Admit: 2024-01-05 | Discharge: 2024-01-05 | Disposition: A | Discharge status: Home / Self Care | Payer: BC Managed Care – PPO | Attending: Emergency Medicine | Admitting: Emergency Medicine

## 2024-01-05 DIAGNOSIS — L0201 Cutaneous abscess of face: Secondary | ICD-10-CM | POA: Diagnosis present

## 2024-01-05 DIAGNOSIS — J4489 Other specified chronic obstructive pulmonary disease: Secondary | ICD-10-CM | POA: Insufficient documentation

## 2024-01-05 DIAGNOSIS — L0291 Cutaneous abscess, unspecified: Secondary | ICD-10-CM

## 2024-01-05 MED ORDER — HYDROCODONE-ACETAMINOPHEN 5-325 MG PO TABS
1.0000 | ORAL_TABLET | Freq: Once | ORAL | Status: AC
  Administered 2024-01-05: 1 via ORAL
  Filled 2024-01-05: qty 1

## 2024-01-05 MED ORDER — DOXYCYCLINE HYCLATE 100 MG PO TABS
100.0000 mg | ORAL_TABLET | Freq: Once | ORAL | Status: AC
  Administered 2024-01-05: 100 mg via ORAL
  Filled 2024-01-05: qty 1

## 2024-01-05 MED ORDER — LIDOCAINE HCL (PF) 1 % IJ SOLN
5.0000 mL | Freq: Once | INTRAMUSCULAR | Status: AC
  Administered 2024-01-05: 5 mL
  Filled 2024-01-05: qty 5

## 2024-01-05 MED ORDER — DOXYCYCLINE HYCLATE 100 MG PO TABS: 100.0000 mg | ORAL_TABLET | Freq: Two times a day (BID) | ORAL | 0 refills | Status: DC

## 2024-01-05 NOTE — ED Provider Notes (Signed)
[facility] Emergency Department Provider Note     Event Date/Time   First MD Initiated Contact with Patient 01/05/24 1658     (approximate)   History   Abscess   HPI  Ann Gray is a 33 y.o. female with a history of asthma and COPD presents to the ED for evaluation of an abscess located on her right lower face.  Patient reports she gets abscesses all the time and "picks at her pimples".  She reports she noticed a pimple last night and placed a pimple patch on it and it has increased in size this morning.  Denies drainage, fever and chills.     Physical Exam   Triage Vital Signs: ED Triage Vitals  Encounter Vitals Group     BP 01/05/24 1307 123/76     Systolic BP Percentile --      Diastolic BP Percentile --      Pulse Rate 01/05/24 1305 79     Resp 01/05/24 1307 17     Temp 01/05/24 1305 98.3 F (36.8 C)     Temp src --      SpO2 01/05/24 1305 96 %     Weight 01/05/24 1305 178 lb 9.2 oz (81 kg)     Height 01/05/24 1305 5\' 5"  (1.651 m)     Head Circumference --      Peak Flow --      Pain Score 01/05/24 1305 3     Pain Loc --      Pain Education --      Exclude from Growth Chart --     Most recent vital signs: Vitals:   01/05/24 1305 01/05/24 1307  BP:  123/76  Pulse: 79   Resp:  17  Temp: 98.3 F (36.8 C)   SpO2: 96%    General Awake, no distress.  HEENT NCAT. PERRL. EOMI. No rhinorrhea. Mucous membranes are moist.  CV:  Good peripheral perfusion.  RESP:  Normal effort.  ABD:  No distention.  Other:  1 cm circular area of erythema to the right inferior cheek with palpable mass like with central scab lesion. Fluctuant to palpation with surrounding indurations. No drainage.   ED Results / Procedures / Treatments   Labs (all labs ordered are listed, but only abnormal results are displayed) Labs Reviewed - No data to display  No results found.  PROCEDURES:  Critical Care performed: No  .Ultrasound ED Soft  Tissue  Date/Time: 01/05/2024 8:06 PM  Performed by: Conrad Roslyn, PA-C Authorized by: Kern Reap A, PA-C   Procedure details:    Indications: localization of abscess     Transverse view:  Visualized Location:    Location: face     Side:  Right (right inferor cheek) Findings:     abscess present .Incision and Drainage  Date/Time: 01/05/2024 8:07 PM  Performed by: Conrad Melba, PA-C Authorized by: Conrad [place]ello, PA-C   Consent:    Consent obtained:  Verbal   Consent given by:  Patient   Risks, benefits, and alternatives were discussed: yes     Risks discussed:  Bleeding, incomplete drainage, pain and infection Location:    Type:  Abscess   Size:  1   Location: face. Pre-procedure details:    Skin preparation:  Povidone-iodine Sedation:    Sedation type:  None Anesthesia:    Anesthesia method:  Local infiltration   Local anesthetic:  Lidocaine 1% w/o epi Procedure type:  Complexity:  Simple Procedure details:    Ultrasound guidance: yes     Incision types:  Stab incision and single straight   Incision depth:  Dermal   Wound management:  Probed and deloculated and irrigated with saline   Drainage:  Purulent and bloody   Drainage amount:  Scant   Wound treatment:  Wound left open   Packing materials:  None Post-procedure details:    Procedure completion:  Tolerated   MEDICATIONS ORDERED IN ED: Medications  lidocaine (PF) (XYLOCAINE) 1 % injection 5 mL (5 mLs Infiltration Given 01/05/24 1808)  HYDROcodone-acetaminophen (NORCO/VICODIN) 5-325 MG per tablet 1 tablet (1 tablet Oral Given 01/05/24 1751)  doxycycline (VIBRA-TABS) tablet 100 mg (100 mg Oral Given 01/05/24 1836)    IMPRESSION / MDM / ASSESSMENT AND PLAN / ED COURSE  I reviewed the triage vital signs and the nursing notes.                               33 y.o. female presents to the emergency department for evaluation and treatment of facial abscess. See HPI for further details.    Differential diagnosis includes, but is not limited to abscess, cyst, comedone, cellulitis    Patient's presentation is most consistent with acute complicated illness / injury requiring diagnostic workup.  Please see procedure note for I&D.  Patient tolerated well.  Patient reports given her history of reoccurring abscesses she is normally placed on doxycycline and complete resolution is obtained.  I will place her on doxycycline. First dose given in ED.  Post I&D care at home provided to the patient's.  ED return precautions discussed.  Patient is in stable condition for discharge home.  All questions and concerns were addressed during this ED visit.  FINAL CLINICAL IMPRESSION(S) / ED DIAGNOSES   Final diagnoses:  Abscess   Rx / DC Orders   ED Discharge Orders          Ordered    doxycycline (VIBRA-TABS) 100 MG tablet  2 times daily        01/05/24 1832            Note:  This document was prepared using Dragon voice recognition software and may include unintentional dictation errors.    Romeo Apple, Laquandra Carrillo A, PA-C 01/05/24 2010    Merwyn Katos, MD 01/07/24 [phone]

## 2024-01-05 NOTE — ED Notes (Signed)
Patient declined discharge vital signs. 

## 2024-01-05 NOTE — Discharge Instructions (Addendum)
Your evaluated in the ED for an abscess.  We performed an incision and drainage.  You have been prescribed antibiotics in which she will need to complete the full course even if symptoms improve.  Keep the area clean and dry.  Use gentle soap and water.  Cover with a gauze to absorb the residual drainage for Band-Aid.  Please refrain from picking at skin or using pimple patches until healing.  Apply ice to the affected area 3 times a day 20 minutes on 20 minutes off to reduce swelling.  Alternate using a warm cough or heating pad to help with healing.  Take ibuprofen for pain as needed.  Monitor for symptoms as discussed including fever, redness, and abnormal drainage color.

## 2024-01-05 NOTE — ED Triage Notes (Signed)
Pt to ED for abscess to right face, states has been there a few days, put a pimple patch on it and it was a lot bigger this am. States was here the other day for similar problem to abd.  Denies fevers.

## 2024-01-09 ENCOUNTER — Other Ambulatory Visit
Admission: RE | Admit: 2024-01-09 | Discharge: 2024-01-09 | Disposition: A | Discharge status: Home / Self Care | Payer: Self-pay | Source: Ambulatory Visit | Attending: Medical Genetics | Admitting: Medical Genetics

## 2024-01-09 DIAGNOSIS — Z006 Encounter for examination for normal comparison and control in clinical research program: Secondary | ICD-10-CM | POA: Insufficient documentation

## 2024-01-21 LAB — GENECONNECT MOLECULAR SCREEN: Genetic Analysis Overall Interpretation: NEGATIVE

## 2024-12-02 NOTE — Progress Notes (Unsigned)
 GYNECOLOGY ANNUAL PHYSICAL EXAM PROGRESS NOTE  Subjective:    Ann Gray is a 33 y.o. No obstetric history on file. female who presents for an annual exam.  The patient {is/is not/has never been:13135} sexually active. The patient participates in regular exercise: {yes/no/not asked:9010}. Has the patient ever been transfused or tattooed?: {yes/no/not asked:9010}. The patient reports that there {is/is not:9024} domestic violence in her life.   The patient has the following complaints today:   Menstrual History: Menarche age: *** No LMP recorded.     Gynecologic History:  Contraception: {method:5051} History of STI's:  Last Pap: ***. Results were: {norm/abn:16337}.  ***Denies/Notes h/o abnormal pap smears. Last mammogram: ***. Results were: {norm/abn:16337}       OB History  No obstetric history on file.    Past Medical History:  Diagnosis Date   Asthma    COPD (chronic obstructive pulmonary disease) (HCC)    Murmur, cardiac     Past Surgical History:  Procedure Laterality Date   TONSILLECTOMY      No family history on file.  Social History   Socioeconomic History   Marital status: Single    Spouse name: Not on file   Number of children: Not on file   Years of education: Not on file   Highest education level: Not on file  Occupational History   Not on file  Tobacco Use   Smoking status: Every Day    Current packs/day: 0.25    Types: Cigarettes   Smokeless tobacco: Never  Vaping Use   Vaping status: Never Used  Substance and Sexual Activity   Alcohol use: Yes    Comment: occ   Drug use: Not Currently   Sexual activity: Not on file  Other Topics Concern   Not on file  Social History Narrative   Not on file   Social Drivers of Health   Tobacco Use: High Risk (02/21/2024)   Received from [facility]   Patient History    Smoking Tobacco Use: Every Day    Smokeless Tobacco Use: Current    Passive Exposure: Not on file   Financial Resource Strain: Low Risk  (01/22/2024)   Received from [facility]   Overall Financial Resource Strain (CARDIA)    Difficulty of Paying Living Expenses: Not hard at all  Food Insecurity: No Food Insecurity (01/22/2024)   Received from [facility]   Epic    Within the past 12 months, you worried that your food would run out before you got the money to buy more.: Never true    Within the past 12 months, the food you bought just didn't last and you didn't have money to get more.: Never true  Transportation Needs: No Transportation Needs (01/22/2024)   Received from [facility] - Transportation    In the past 12 months, has lack of transportation kept you from medical appointments or from getting medications?: No    Lack of Transportation (Non-Medical): No  Physical Activity: Not on file  Stress: Not on file  Social Connections: Not on file  Intimate Partner Violence: Not on file  Depression (EYV7-0): Not on file  Alcohol Screen: Not on file  Housing: Unknown (01/22/2024)   Received from [facility]   Epic    In the last 12 months, was there a time when you were not able to pay the mortgage or rent on time?: No  Number of Times Moved in the Last Year: Not on file    At any time in the past 12 months, were you homeless or living in a shelter (including now)?: No  Utilities: Not At Risk (01/22/2024)   Received from [facility] Utilities    Threatened with loss of utilities: No  Health Literacy: Not on file    Medications Ordered Prior to Encounter[1]  Allergies[2]   Review of Systems Constitutional: negative for chills, fatigue, fevers and sweats Eyes: negative for irritation, redness and visual disturbance Ears, nose, mouth, throat, and face: negative for hearing loss, nasal congestion, snoring and tinnitus Respiratory: negative for asthma, cough, sputum Cardiovascular:  negative for chest pain, dyspnea, exertional chest pressure/discomfort, irregular heart beat, palpitations and syncope Gastrointestinal: negative for abdominal pain, change in bowel habits, nausea and vomiting Genitourinary: negative for abnormal menstrual periods, genital lesions, sexual problems and vaginal discharge, dysuria and urinary incontinence Integument/breast: negative for breast lump, breast tenderness and nipple discharge Hematologic/lymphatic: negative for bleeding and easy bruising Musculoskeletal:negative for back pain and muscle weakness Neurological: negative for dizziness, headaches, vertigo and weakness Endocrine: negative for diabetic symptoms including polydipsia, polyuria and skin dryness Allergic/Immunologic: negative for hay fever and urticaria      Objective:  There were no vitals taken for this visit. There is no height or weight on file to calculate BMI.    General Appearance:    Alert, cooperative, no distress, appears stated age  Head:    Normocephalic, without obvious abnormality, atraumatic  Eyes:    PERRL, conjunctiva/corneas clear, EOM's intact, both eyes  Ears:    Normal external ear canals, both ears  Nose:   Nares normal, septum midline, mucosa normal, no drainage or sinus tenderness  Throat:   Lips, mucosa, and tongue normal; teeth and gums normal  Neck:   Supple, symmetrical, trachea midline, no adenopathy; thyroid: no enlargement/tenderness/nodules; no carotid bruit or JVD  Back:     Symmetric, no curvature, ROM normal, no CVA tenderness  Lungs:     Clear to auscultation bilaterally, respirations unlabored  Chest Wall:    No tenderness or deformity   Heart:    Regular rate and rhythm, S1 and S2 normal, no murmur, rub or gallop  Breast Exam:    No tenderness, masses, or nipple abnormality  Abdomen:     Soft, non-tender, bowel sounds active all four quadrants, no masses, no organomegaly.    Genitalia:    Pelvic:external genitalia normal, vagina  without lesions, discharge, or tenderness, rectovaginal septum  normal. Cervix normal in appearance, no cervical motion tenderness, no adnexal masses or tenderness.  Uterus normal size, shape, mobile, regular contours, nontender.  Rectal:    Normal external sphincter.  No hemorrhoids appreciated. Internal exam not done.   Extremities:   Extremities normal, atraumatic, no cyanosis or edema  Pulses:   2+ and symmetric all extremities  Skin:   Skin color, texture, turgor normal, no rashes or lesions  Lymph nodes:   Cervical, supraclavicular, and axillary nodes normal  Neurologic:   CNII-XII intact, normal strength, sensation and reflexes throughout   .  Labs:  Lab Results  Component Value Date   WBC 10.2 01/17/2021   HGB 13.0 01/17/2021   HCT 38.7 01/17/2021   MCV 86.4 01/17/2021   PLT 283 01/17/2021    Lab Results  Component Value Date   CREATININE 0.71 01/17/2021   BUN 10 01/17/2021   NA 140 01/17/2021   K 3.8 01/17/2021  CL 105 01/17/2021   CO2 26 01/17/2021    Lab Results  Component Value Date   ALT 14 01/17/2021   AST 14 (L) 01/17/2021   ALKPHOS 70 01/17/2021   BILITOT 0.8 01/17/2021    No results found for: TSH   Assessment:   No diagnosis found.   Plan:  Blood tests: {blood tests:13147}. Breast self exam technique reviewed and patient encouraged to perform self-exam monthly. Contraception: {contraceptive methods:5051}. Discussed healthy lifestyle modifications. Mammogram {discussed/ordered:14545} Pap smear {discussed/ordered:14545}. Flu vaccine: Follow up in 1 year for annual exam   Booker, Kaydence Menard, CMA  OB/GYN    [1]  Current Outpatient Medications on File Prior to Visit  Medication Sig Dispense Refill   brompheniramine-pseudoephedrine-DM 30-2-10 MG/5ML syrup Take 10 mLs by mouth 4 (four) times daily as needed. 200 mL 0   dicyclomine  (BENTYL ) 20 MG tablet Take 1 tablet (20 mg total) by mouth 4 (four) times daily -  before meals and at  bedtime. 120 tablet 1   doxycycline  (VIBRA -TABS) 100 MG tablet Take 1 tablet (100 mg total) by mouth 2 (two) times daily. 14 tablet 0   rOPINIRole  (REQUIP ) 1 MG tablet Take 1 tablet (1 mg total) by mouth at bedtime. 90 tablet 0   No current facility-administered medications on file prior to visit.  [2]  Allergies Allergen Reactions   Septra [Sulfamethoxazole-Trimethoprim]    Sulfa Antibiotics Rash    Pt pretty sure of splotches when taking

## 2024-12-03 ENCOUNTER — Other Ambulatory Visit (HOSPITAL_COMMUNITY)
Admission: RE | Admit: 2024-12-03 | Discharge: 2024-12-03 | Disposition: A | Source: Ambulatory Visit | Attending: Certified Nurse Midwife | Admitting: Certified Nurse Midwife

## 2024-12-03 ENCOUNTER — Ambulatory Visit: Admitting: Certified Nurse Midwife

## 2024-12-03 VITALS — BP 130/97 | HR 91 | Resp 16 | Ht 65.0 in | Wt 231.7 lb

## 2024-12-03 DIAGNOSIS — Z01419 Encounter for gynecological examination (general) (routine) without abnormal findings: Secondary | ICD-10-CM | POA: Diagnosis not present

## 2024-12-03 DIAGNOSIS — Z124 Encounter for screening for malignant neoplasm of cervix: Secondary | ICD-10-CM | POA: Insufficient documentation

## 2024-12-03 DIAGNOSIS — Z113 Encounter for screening for infections with a predominantly sexual mode of transmission: Secondary | ICD-10-CM

## 2024-12-03 DIAGNOSIS — Z1159 Encounter for screening for other viral diseases: Secondary | ICD-10-CM

## 2024-12-03 DIAGNOSIS — Z114 Encounter for screening for human immunodeficiency virus [HIV]: Secondary | ICD-10-CM

## 2024-12-03 NOTE — Progress Notes (Signed)
 GYNECOLOGY ANNUAL PHYSICAL EXAM PROGRESS NOTE  Subjective:    Ann Gray is a 33 y.o. No obstetric history on file. female who presents for an annual exam.  The patient is sexually active. The patient participates in regular exercise: no. Has the patient ever been transfused or tattooed?: yes. The patient reports that there is not domestic violence in her life. Periods have been regular, heavier on first days. Experiences mild cramping, has been told in the past that she may have ovarian cyst rupture when she was a teenager.   The patient has the following complaints today: Patient would like to conceive in the near future and she has concerns that she may not be able to, because of cyst that she has on her ovaries.   Menstrual History: Menarche age: 44 Patient's last menstrual period was 11/12/2024 (exact date). Period Cycle (Days): 18 Period Duration (Days): 5 Period Pattern: Regular Menstrual Flow: Heavy, Moderate, Light Menstrual Control: Maxi pad, Tampon Menstrual Control Change Freq (Hours): 2-3 Dysmenorrhea: (!) Severe Dysmenorrhea Symptoms: Cramping   Gynecologic History:  Contraception: none History of STI's: Denies Last Pap: Unknown. Results were: normal.  Denies h/o abnormal pap smears. Last mammogram: Not age appropriate  Upstream - 12/03/24 1027       Pregnancy Intention Screening   Does the patient want to become pregnant in the next year? Ok Either Way    Does the patient's partner want to become pregnant in the next year? Ok Either Way    Would the patient like to discuss contraceptive options today? No      Contraception Wrap Up   Current Method Pregnant/Seeking Pregnancy    End Method Pregnant/Seeking Pregnancy    Contraception Counseling Provided No    How was the end contraceptive method provided? N/A            OB History  No obstetric history on file.    Past Medical History:  Diagnosis Date   Asthma    COPD (chronic obstructive  pulmonary disease) (HCC)    Murmur, cardiac     Past Surgical History:  Procedure Laterality Date   TONSILLECTOMY      No family history on file.  Social History   Socioeconomic History   Marital status: Single    Spouse name: Not on file   Number of children: Not on file   Years of education: Not on file   Highest education level: Not on file  Occupational History   Not on file  Tobacco Use   Smoking status: Every Day    Current packs/day: 0.25    Types: Cigarettes   Smokeless tobacco: Never  Vaping Use   Vaping status: Never Used  Substance and Sexual Activity   Alcohol use: Yes    Comment: occ   Drug use: Not Currently   Sexual activity: Not on file  Other Topics Concern   Not on file  Social History Narrative   Not on file   Social Drivers of Health   Tobacco Use: High Risk (02/21/2024)   Received from [facility]   Patient History    Smoking Tobacco Use: Every Day    Smokeless Tobacco Use: Current    Passive Exposure: Not on file  Financial Resource Strain: Low Risk  (01/22/2024)   Received from [facility]   Overall Financial Resource Strain (CARDIA)    Difficulty of Paying Living Expenses: Not hard at all  Food Insecurity: No Food Insecurity (  01/22/2024)   Received from [facility]   Epic    Within the past 12 months, you worried that your food would run out before you got the money to buy more.: Never true    Within the past 12 months, the food you bought just didn't last and you didn't have money to get more.: Never true  Transportation Needs: No Transportation Needs (01/22/2024)   Received from [facility] - Transportation    In the past 12 months, has lack of transportation kept you from medical appointments or from getting medications?: No    Lack of Transportation (Non-Medical): No  Physical Activity: Not on file  Stress: Not on file  Social Connections: Not on file   Intimate Partner Violence: Not on file  Depression (PHQ2-9): Low Risk (12/03/2024)   Depression (PHQ2-9)    PHQ-2 Score: 0  Alcohol Screen: Not on file  Housing: Unknown (01/22/2024)   Received from [facility]   Epic    In the last 12 months, was there a time when you were not able to pay the mortgage or rent on time?: No    Number of Times Moved in the Last Year: Not on file    At any time in the past 12 months, were you homeless or living in a shelter (including now)?: No  Utilities: Not At Risk (01/22/2024)   Received from [facility] Utilities    Threatened with loss of utilities: No  Health Literacy: Not on file    Medications Ordered Prior to Encounter[1]  Allergies[2]   Review of Systems Constitutional: negative for chills, fatigue, fevers and sweats Eyes: negative for irritation, redness and visual disturbance Ears, nose, mouth, throat, and face: negative for hearing loss, nasal congestion, snoring and tinnitus Respiratory: negative for asthma, cough, sputum Cardiovascular: negative for chest pain, dyspnea, exertional chest pressure/discomfort, irregular heart beat, palpitations and syncope Gastrointestinal: negative for abdominal pain, change in bowel habits, nausea and vomiting Genitourinary: negative for abnormal menstrual periods, genital lesions, sexual problems and vaginal discharge, dysuria and urinary incontinence Integument/breast: negative for breast lump, breast tenderness and nipple discharge Hematologic/lymphatic: negative for bleeding and easy bruising Musculoskeletal:negative for back pain and muscle weakness Neurological: negative for dizziness, headaches, vertigo and weakness Endocrine: negative for diabetic symptoms including polydipsia, polyuria and skin dryness Allergic/Immunologic: negative for hay fever and urticaria      Objective:  Blood pressure (!) 130/97, pulse 91, resp. rate 16, height 5' 5 (1.651  m), weight 105.1 kg, last menstrual period 11/12/2024. Body mass index is 38.56 kg/m.    General Appearance:    Alert, cooperative, no distress, appears stated age  Head:    Normocephalic, without obvious abnormality, atraumatic  Eyes:    PERRL, conjunctiva/corneas clear, EOM's intact, both eyes  Ears:    Normal external ear canals, both ears  Nose:   Nares normal, septum midline, mucosa normal, no drainage or sinus tenderness  Throat:   Lips, mucosa, and tongue normal; teeth and gums normal  Neck:   Supple, symmetrical, trachea midline, no adenopathy; thyroid: no enlargement/tenderness/nodules; no carotid bruit or JVD  Back:     Symmetric, no curvature, ROM normal, no CVA tenderness  Lungs:     Clear to auscultation bilaterally, respirations unlabored  Chest Wall:    No tenderness or deformity   Heart:    Regular rate and rhythm, S1 and S2 normal, no murmur, rub or gallop  Breast Exam:    No tenderness, masses, or nipple abnormality  Abdomen:     Soft, non-tender, bowel sounds active all four quadrants, no masses, no organomegaly.    Genitalia:    Pelvic:external genitalia normal, vagina without lesions, discharge, or tenderness, rectovaginal septum  normal. Cervix normal in appearance, no cervical motion tenderness, no adnexal masses or tenderness.  Uterus normal size, shape, mobile, regular contours, nontender.  Rectal:    Normal external sphincter.  No hemorrhoids appreciated. Internal exam not done.   Extremities:   Extremities normal, atraumatic, no cyanosis or edema  Pulses:   2+ and symmetric all extremities  Skin:   Skin color, texture, turgor normal, no rashes or lesions  Lymph nodes:   Cervical, supraclavicular, and axillary nodes normal  Neurologic:   CNII-XII intact, normal strength, sensation and reflexes throughout   .  Labs:  Lab Results  Component Value Date   WBC 10.2 01/17/2021   HGB 13.0 01/17/2021   HCT 38.7 01/17/2021   MCV 86.4 01/17/2021   PLT 283 01/17/2021     Lab Results  Component Value Date   CREATININE 0.71 01/17/2021   BUN 10 01/17/2021   NA 140 01/17/2021   K 3.8 01/17/2021   CL 105 01/17/2021   CO2 26 01/17/2021    Lab Results  Component Value Date   ALT 14 01/17/2021   AST 14 (L) 01/17/2021   ALKPHOS 70 01/17/2021   BILITOT 0.8 01/17/2021    No results found for: TSH   Assessment:   1. Encounter for well woman exam with routine gynecological exam   2. Encounter for screening for HIV   3. Need for hepatitis C screening test   4. Screen for STD (sexually transmitted disease)   5. Cervical cancer screening      Plan:  Blood tests: Pending. Breast self exam technique reviewed and patient encouraged to perform self-exam monthly. Contraception: none. Discussed healthy lifestyle modifications. Mammogram : Not age appropriate Pap smear ordered. Flu vaccine: Follow up in 1 year for annual exam  Fertility - Discussed healthy diet and exercise - Labs drawn today - Will start taking a prenatal vitamin with Iron -Recommended reaching out to [facility] to look in to receiving IUI care. They are most interested in lower cost options to try to conceive and a more personal approach to care. Contact information.   Damien Parsley, CNM [facility]      [1]  Current Outpatient Medications on File Prior to Visit  Medication Sig Dispense Refill   albuterol  (VENTOLIN  HFA) 108 (90 Base) MCG/ACT inhaler Inhale 2 puffs into the lungs every 4 (four) hours as needed for wheezing or shortness of breath.     amphetamine-dextroamphetamine (ADDERALL XR) 20 MG 24 hr capsule Take 20 mg by mouth 2 (two) times daily.     ARIPiprazole (ABILIFY) 10 MG tablet Take 10 mg by mouth every morning.     buPROPion (WELLBUTRIN XL) 300 MG 24 hr tablet Take 300 mg by mouth every morning.     clonazePAM (KLONOPIN) 1 MG tablet Take 1 mg by mouth as needed.     No current facility-administered medications on file  prior to visit.  [2]  Allergies Allergen Reactions   Septra [Sulfamethoxazole-Trimethoprim]    Sulfa Antibiotics Rash    Pt pretty sure of splotches when taking

## 2024-12-03 NOTE — Patient Instructions (Signed)
 Preventive Care 28-33 Years Old, Female  Preventive care refers to lifestyle choices and visits with your health care provider that can promote health and wellness. Preventive care visits are also called wellness exams. What can I expect for my preventive care visit? Counseling During your preventive care visit, your health care provider may ask about your: Medical history, including: Past medical problems. Family medical history. Pregnancy history. Current health, including: Menstrual cycle. Method of birth control. Emotional well-being. Home life and relationship well-being. Sexual activity and sexual health. Lifestyle, including: Alcohol, nicotine or tobacco, and drug use. Access to firearms. Diet, exercise, and sleep habits. Work and work Astronomer. Sunscreen use. Safety issues such as seatbelt and bike helmet use. Physical exam Your health care provider may check your: Height and weight. These may be used to calculate your BMI (body mass index). BMI is a measurement that tells if you are at a healthy weight. Waist circumference. This measures the distance around your waistline. This measurement also tells if you are at a healthy weight and may help predict your risk of certain diseases, such as type 2 diabetes and high blood pressure. Heart rate and blood pressure. Body temperature. Skin for abnormal spots. What immunizations do I need?  Vaccines are usually given at various ages, according to a schedule. Your health care provider will recommend vaccines for you based on your age, medical history, and lifestyle or other factors, such as travel or where you work. What tests do I need? Screening Your health care provider may recommend screening tests for certain conditions. This may include: Pelvic exam and Pap test. Lipid and cholesterol levels. Diabetes screening. This is done by checking your blood sugar (glucose) after you have not eaten for a while  (fasting). Hepatitis B test. Hepatitis C test. HIV (human immunodeficiency virus) test. STI (sexually transmitted infection) testing, if you are at risk. BRCA-related cancer screening. This may be done if you have a family history of breast, ovarian, tubal, or peritoneal cancers. Talk with your health care provider about your test results, treatment options, and if necessary, the need for more tests. Follow these instructions at home: Eating and drinking  Eat a healthy diet that includes fresh fruits and vegetables, whole grains, lean protein, and low-fat dairy products. Take vitamin and mineral supplements as recommended by your health care provider. Do not drink alcohol if: Your health care provider tells you not to drink. You are pregnant, may be pregnant, or are planning to become pregnant. If you drink alcohol: Limit how much you have to 0-1 drink a day. Know how much alcohol is in your drink. In the [place] one drink equals one 12 oz bottle of beer (355 mL), one 5 oz glass of wine (148 mL), or one 1 oz glass of hard liquor (44 mL). Lifestyle Brush your teeth every morning and night with fluoride toothpaste. Floss one time each day. Exercise for at least 30 minutes 5 or more days each week. Do not use any products that contain nicotine or tobacco. These products include cigarettes, chewing tobacco, and vaping devices, such as e-cigarettes. If you need help quitting, ask your health care provider. Do not use drugs. If you are sexually active, practice safe sex. Use a condom or other form of protection to prevent STIs. If you do not wish to become pregnant, use a form of birth control. If you plan to become pregnant, see your health care provider for a prepregnancy visit. Find healthy ways to manage stress, such as:  Meditation, yoga, or listening to music. Journaling. Talking to a trusted person. Spending time with friends and family. Minimize exposure to UV radiation to reduce your  risk of skin cancer. Safety Always wear your seat belt while driving or riding in a vehicle. Do not drive: If you have been drinking alcohol. Do not ride with someone who has been drinking. If you have been using any mind-altering substances or drugs. While texting. When you are tired or distracted. Wear a helmet and other protective equipment during sports activities. If you have firearms in your house, make sure you follow all gun safety procedures. Seek help if you have been physically or sexually abused. What's next? Go to your health care provider once a year for an annual wellness visit. Ask your health care provider how often you should have your eyes and teeth checked. Stay up to date on all vaccines. This information is not intended to replace advice given to you by your health care provider. Make sure you discuss any questions you have with your health care provider. Document Revised: 06/01/2021 Document Reviewed: 06/01/2021 Elsevier Patient Education  2024 [employer]     How to Do a Breast Self-Exam Doing breast self-exams can help you stay healthy. They're one way to know what's normal for your breasts. They can help you catch a problem while it's still small and can be treated. You need to: Check your breasts often. Tell your doctor about any changes. You should do breast self-exams even if you have breast implants. What you need: A mirror. A well-lit room. A pillow or other soft object. How to do a breast self-exam Look for changes  Take off all the clothes above your waist. Stand in front of a mirror in a room with good lighting. Put your hands down at your sides. Compare your breasts in the mirror. Look for difference between them, such as: Differences in shape. Differences in size. Wrinkles, dips, and bumps in one breast and not the other. Look at each breast for skin changes, such as: Redness. Scaly spots. Spots where your skin is  thicker. Dimpling. Open sores. Look for changes in your nipples, such as: Fluid coming out of a nipple. Fluid around a nipple. Bleeding. Dimpling. Redness. A nipple that looks pushed in or that has changed position. Feel for changes Lie on your back. Feel each breast. To do this: Pick a breast to feel. Place a pillow under the shoulder closest to that breast. Put the arm closest to that breast behind your head. Feel the breast using the hand of your other arm. Use the pads of your three middle fingers to make small circles starting near the nipple. Use light, medium, and firm pressure. Keep making circles, moving down over the breast. Stop when you feel your ribs. Start making circles with your fingers again, this time going up until you reach your collarbone. Then, make circles out across your breast and into your armpit area. Squeeze your nipple. Check for fluid and lumps. Do these steps again to check your other breast. Sit or stand in the tub or shower. With soapy water on your skin, feel each breast the same way you did when you were lying down. Write down what you find Writing down what you find can help you keep track of what you want to tell your doctor. Write down: What's normal for each breast. Any changes you find. Write down: The kind of change. If your breast feels tender or painful.  Any lump you find. Write down its size and where it is. When you last had your period. General tips If you're breastfeeding, the best time to check your breasts is after you feed your baby or after you use a breast pump. If you get a period, the best time to check your breasts is 5-7 days after your period ends. With time, you'll get more used to doing the self-exam. You'll also start to know if there are changes in your breasts. Contact a doctor if: You see a change in the shape or size of your breasts or nipples. You see a change in the skin of your breast or nipples. You have fluid  coming from your nipples that isn't normal. You find a new lump or thick area. You have breast pain. You have any concerns about your breast health. This information is not intended to replace advice given to you by your health care provider. Make sure you discuss any questions you have with your health care provider. Document Revised: 02/13/2024 Document Reviewed: 02/13/2024 Elsevier Patient Education  2025 [employer].

## 2024-12-04 LAB — CERVICOVAGINAL ANCILLARY ONLY
Bacterial Vaginitis (gardnerella): POSITIVE — AB
Candida Glabrata: POSITIVE — AB
Candida Vaginitis: NEGATIVE
Chlamydia: NEGATIVE
Comment: NEGATIVE
Comment: NEGATIVE
Comment: NEGATIVE
Comment: NEGATIVE
Comment: NEGATIVE
Comment: NORMAL
Neisseria Gonorrhea: NEGATIVE
Trichomonas: POSITIVE — AB

## 2024-12-04 LAB — HIV ANTIBODY (ROUTINE TESTING W REFLEX): HIV Screen 4th Generation wRfx: NONREACTIVE

## 2024-12-04 LAB — HEPATITIS C ANTIBODY: Hep C Virus Ab: NONREACTIVE

## 2024-12-04 LAB — SYPHILIS: RPR W/REFLEX TO RPR TITER AND TREPONEMAL ANTIBODIES, TRADITIONAL SCREENING AND DIAGNOSIS ALGORITHM: RPR Ser Ql: NONREACTIVE

## 2024-12-05 LAB — CYTOLOGY - PAP
Adequacy: ABSENT
Comment: NEGATIVE
Diagnosis: NEGATIVE
High risk HPV: NEGATIVE

## 2024-12-08 ENCOUNTER — Ambulatory Visit: Payer: Self-pay | Admitting: Certified Nurse Midwife

## 2024-12-08 ENCOUNTER — Other Ambulatory Visit: Payer: Self-pay | Admitting: Certified Nurse Midwife

## 2024-12-08 MED ORDER — METRONIDAZOLE 500 MG PO TABS: ORAL_TABLET | ORAL | 0 refills | Status: AC
